# Patient Record
Sex: Male | Born: 1950 | Race: White | Hispanic: No | Smoking: Former smoker
Health system: Southern US, Community
[De-identification: ages and names within clinical notes are randomized; demographics above are authoritative.]

## PROBLEM LIST (undated history)

## (undated) DIAGNOSIS — I452 Bifascicular block: Secondary | ICD-10-CM

## (undated) DIAGNOSIS — G959 Disease of spinal cord, unspecified: Secondary | ICD-10-CM

## (undated) DIAGNOSIS — E291 Testicular hypofunction: Secondary | ICD-10-CM

## (undated) DIAGNOSIS — M1990 Unspecified osteoarthritis, unspecified site: Secondary | ICD-10-CM

## (undated) DIAGNOSIS — N882 Stricture and stenosis of cervix uteri: Secondary | ICD-10-CM

## (undated) DIAGNOSIS — M5412 Radiculopathy, cervical region: Secondary | ICD-10-CM

## (undated) DIAGNOSIS — I10 Essential (primary) hypertension: Secondary | ICD-10-CM

## (undated) DIAGNOSIS — E785 Hyperlipidemia, unspecified: Secondary | ICD-10-CM

## (undated) DIAGNOSIS — I4510 Unspecified right bundle-branch block: Secondary | ICD-10-CM

## (undated) DIAGNOSIS — I2510 Atherosclerotic heart disease of native coronary artery without angina pectoris: Secondary | ICD-10-CM

## (undated) DIAGNOSIS — N529 Male erectile dysfunction, unspecified: Secondary | ICD-10-CM

## (undated) HISTORY — PX: JOINT REPLACEMENT: SHX530

## (undated) HISTORY — PX: CORONARY ANGIOPLASTY: SHX604

## (undated) HISTORY — PX: COLONOSCOPY: SHX174

---

## 2013-03-08 HISTORY — PX: CARDIAC CATHETERIZATION: SHX172

## 2014-02-05 HISTORY — PX: TOTAL HIP ARTHROPLASTY: SHX124

## 2015-02-05 DIAGNOSIS — I2510 Atherosclerotic heart disease of native coronary artery without angina pectoris: Secondary | ICD-10-CM | POA: Insufficient documentation

## 2015-02-05 DIAGNOSIS — I10 Essential (primary) hypertension: Secondary | ICD-10-CM | POA: Insufficient documentation

## 2015-02-05 DIAGNOSIS — Z8639 Personal history of other endocrine, nutritional and metabolic disease: Secondary | ICD-10-CM | POA: Insufficient documentation

## 2015-02-05 DIAGNOSIS — E782 Mixed hyperlipidemia: Secondary | ICD-10-CM | POA: Insufficient documentation

## 2015-02-05 DIAGNOSIS — N529 Male erectile dysfunction, unspecified: Secondary | ICD-10-CM | POA: Insufficient documentation

## 2018-09-03 DIAGNOSIS — M1711 Unilateral primary osteoarthritis, right knee: Secondary | ICD-10-CM | POA: Insufficient documentation

## 2019-06-21 ENCOUNTER — Other Ambulatory Visit: Payer: Self-pay | Admitting: Podiatry

## 2019-06-26 NOTE — Anesthesia Preprocedure Evaluation (Addendum)
Anesthesia Evaluation  Patient identified by MRN, date of birth, ID band Patient awake    Reviewed: Allergy & Precautions, NPO status , Patient's Chart, lab work & pertinent test results, reviewed documented beta blocker date and time   History of Anesthesia Complications Negative for: history of anesthetic complications  Airway Mallampati: II  TM Distance: >3 FB Neck ROM: Full    Dental   Pulmonary Current Smoker and Patient abstained from smoking.,    breath sounds clear to auscultation       Cardiovascular hypertension, (-) angina(-) DOE  Rhythm:Regular Rate:Normal   HLD   Neuro/Psych    GI/Hepatic neg GERD  ,  Endo/Other    Renal/GU      Musculoskeletal  (+) Arthritis ,   Abdominal (+) + obese (BMI 36),   Peds  Hematology   Anesthesia Other Findings   Reproductive/Obstetrics                             Anesthesia Physical Anesthesia Plan  ASA: II  Anesthesia Plan: General   Post-op Pain Management:    Induction: Intravenous  PONV Risk Score and Plan: 1 and Propofol infusion, TIVA and Treatment may vary due to age or medical condition  Airway Management Planned: Natural Airway and Nasal Cannula  Additional Equipment:   Intra-op Plan:   Post-operative Plan:   Informed Consent: I have reviewed the patients History and Physical, chart, labs and discussed the procedure including the risks, benefits and alternatives for the proposed anesthesia with the patient or authorized representative who has indicated his/her understanding and acceptance.       Plan Discussed with: CRNA and Anesthesiologist  Anesthesia Plan Comments:        Anesthesia Quick Evaluation

## 2019-06-27 ENCOUNTER — Other Ambulatory Visit: Payer: Self-pay

## 2019-06-27 ENCOUNTER — Encounter: Payer: Self-pay | Admitting: Podiatry

## 2019-07-02 ENCOUNTER — Other Ambulatory Visit: Payer: Self-pay

## 2019-07-02 ENCOUNTER — Other Ambulatory Visit
Admission: RE | Admit: 2019-07-02 | Discharge: 2019-07-02 | Disposition: A | Discharge status: Home / Self Care | Payer: Medicare Other | Source: Ambulatory Visit | Attending: Podiatry | Admitting: Podiatry

## 2019-07-02 DIAGNOSIS — Z01812 Encounter for preprocedural laboratory examination: Secondary | ICD-10-CM | POA: Diagnosis present

## 2019-07-02 DIAGNOSIS — Z20822 Contact with and (suspected) exposure to covid-19: Secondary | ICD-10-CM | POA: Diagnosis not present

## 2019-07-02 LAB — SARS CORONAVIRUS 2 (TAT 6-24 HRS): SARS Coronavirus 2: NEGATIVE

## 2019-07-02 NOTE — Discharge Instructions (Signed)
[facility]  POST OPERATIVE INSTRUCTIONS FOR DR. TROXLER, DR. FOWLER, AND DR. BAK[facility] PODIATRY DEPART[facility]   1. Take your medication as prescribed.  Pain medication should be taken only as needed.  2. Keep the dressing clean, dry and intact.  3. Keep your foot elevated above the heart level for the first 48 hours.  4. Walking to the bathroom and brief periods of walking are acceptable, unless we have instructed you to be non-weight bearing.  5. Always wear your post-op shoe when walking.  Always use your crutches if you are to be non-weight bearing.  6. Do not take a shower. Baths are permissible as long as the foot is kept out of the water.   7. Every hour you are awake:  - Bend your knee 15 times. - Flex foot 15 times - Massage calf 15 times  8. Call [facility] ([phone]) if any of the following problems occur: - You develop a temperature or fever. - The bandage becomes saturated with blood. - Medication does not stop your pain. - Injury of the foot occurs. - Any symptoms of infection including redness, odor, or red streaks running from wound.   General Anesthesia, Adult, Care After This sheet gives you information about how to care for yourself after your procedure. Your health care provider may also give you more specific instructions. If you have problems or questions, contact your health care provider. What can I expect after the procedure? After the procedure, the following side effects are common:  Pain or discomfort at the IV site.  Nausea.  Vomiting.  Sore throat.  Trouble concentrating.  Feeling cold or chills.  Weak or tired.  Sleepiness and fatigue.  Soreness and body aches. These side effects can affect parts of the body that were not involved in surgery. Follow these instructions at home:  For at least 24 hours after the procedure:  Have a responsible adult stay with you. It is  important to have someone help care for you until you are awake and alert.  Rest as needed.  Do not: ? Participate in activities in which you could fall or become injured. ? Drive. ? Use heavy machinery. ? Drink alcohol. ? Take sleeping pills or medicines that cause drowsiness. ? Make important decisions or sign legal documents. ? Take care of children on your own. Eating and drinking  Follow any instructions from your health care provider about eating or drinking restrictions.  When you feel hungry, start by eating small amounts of foods that are soft and easy to digest (bland), such as toast. Gradually return to your regular diet.  Drink enough fluid to keep your urine pale yellow.  If you vomit, rehydrate by drinking water, juice, or clear broth. General instructions  If you have sleep apnea, surgery and certain medicines can increase your risk for breathing problems. Follow instructions from your health care provider about wearing your sleep device: ? Anytime you are sleeping, including during daytime naps. ? While taking prescription pain medicines, sleeping medicines, or medicines that make you drowsy.  Return to your normal activities as told by your health care provider. Ask your health care provider what activities are safe for you.  Take over-the-counter and prescription medicines only as told by your health care provider.  If you smoke, do not smoke without supervision.  Keep all follow-up visits as told by your health care provider. This is important. Contact a health care provider if:    You have nausea or vomiting that does not get better with medicine.  You cannot eat or drink without vomiting.  You have pain that does not get better with medicine.  You are unable to pass urine.  You develop a skin rash.  You have a fever.  You have redness around your IV site that gets worse. Get help right away if:  You have difficulty breathing.  You have chest  pain.  You have blood in your urine or stool, or you vomit blood. Summary  After the procedure, it is common to have a sore throat or nausea. It is also common to feel tired.  Have a responsible adult stay with you for the first 24 hours after general anesthesia. It is important to have someone help care for you until you are awake and alert.  When you feel hungry, start by eating small amounts of foods that are soft and easy to digest (bland), such as toast. Gradually return to your regular diet.  Drink enough fluid to keep your urine pale yellow.  Return to your normal activities as told by your health care provider. Ask your health care provider what activities are safe for you. This information is not intended to replace advice given to you by your health care provider. Make sure you discuss any questions you have with your health care provider. Document Revised: 02/25/2017 Document Reviewed: 10/08/2016 Elsevier Patient Education  2020 [employer] Inc[employer]  

## 2019-07-04 ENCOUNTER — Encounter: Payer: Self-pay | Admitting: Podiatry

## 2019-07-04 ENCOUNTER — Ambulatory Visit: Payer: Medicare Other | Admitting: Anesthesiology

## 2019-07-04 ENCOUNTER — Other Ambulatory Visit: Payer: Self-pay

## 2019-07-04 ENCOUNTER — Ambulatory Visit
Admission: RE | Admit: 2019-07-04 | Discharge: 2019-07-04 | Disposition: A | Discharge status: Home / Self Care | Payer: Medicare Other | Attending: Podiatry | Admitting: Podiatry

## 2019-07-04 ENCOUNTER — Encounter
Admission: RE | Disposition: A | Discharge status: Home / Self Care | Payer: Self-pay | Source: Home / Self Care | Attending: Podiatry

## 2019-07-04 DIAGNOSIS — G5762 Lesion of plantar nerve, left lower limb: Secondary | ICD-10-CM | POA: Diagnosis present

## 2019-07-04 DIAGNOSIS — M170 Bilateral primary osteoarthritis of knee: Secondary | ICD-10-CM | POA: Insufficient documentation

## 2019-07-04 DIAGNOSIS — I2510 Atherosclerotic heart disease of native coronary artery without angina pectoris: Secondary | ICD-10-CM | POA: Diagnosis not present

## 2019-07-04 DIAGNOSIS — Z79899 Other long term (current) drug therapy: Secondary | ICD-10-CM | POA: Diagnosis not present

## 2019-07-04 DIAGNOSIS — F17210 Nicotine dependence, cigarettes, uncomplicated: Secondary | ICD-10-CM | POA: Insufficient documentation

## 2019-07-04 DIAGNOSIS — E782 Mixed hyperlipidemia: Secondary | ICD-10-CM | POA: Diagnosis not present

## 2019-07-04 DIAGNOSIS — I10 Essential (primary) hypertension: Secondary | ICD-10-CM | POA: Diagnosis not present

## 2019-07-04 HISTORY — DX: Essential (primary) hypertension: I10

## 2019-07-04 HISTORY — DX: Unspecified osteoarthritis, unspecified site: M19.90

## 2019-07-04 HISTORY — DX: Hyperlipidemia, unspecified: E78.5

## 2019-07-04 HISTORY — PX: EXCISION MORTON'S NEUROMA: SHX5013

## 2019-07-04 SURGERY — EXCISION, MORTON'S NEUROMA
Anesthesia: General | Site: Toe | Laterality: Left

## 2019-07-04 MED ORDER — FENTANYL CITRATE (PF) 100 MCG/2ML IJ SOLN
INTRAMUSCULAR | Status: DC | PRN
  Administered 2019-07-04: 100 ug via INTRAVENOUS

## 2019-07-04 MED ORDER — PROMETHAZINE HCL 25 MG/ML IJ SOLN: 6.2500 mg | INTRAMUSCULAR | Status: DC | PRN

## 2019-07-04 MED ORDER — POVIDONE-IODINE 7.5 % EX SOLN: Freq: Once | CUTANEOUS | Status: AC

## 2019-07-04 MED ORDER — CEFAZOLIN SODIUM-DEXTROSE 2-4 GM/100ML-% IV SOLN
2.0000 g | INTRAVENOUS | Status: AC
  Administered 2019-07-04: 2 g via INTRAVENOUS

## 2019-07-04 MED ORDER — MIDAZOLAM HCL 2 MG/2ML IJ SOLN
INTRAMUSCULAR | Status: DC | PRN
  Administered 2019-07-04: 2 mg via INTRAVENOUS

## 2019-07-04 MED ORDER — OXYCODONE HCL 5 MG PO TABS: 5.0000 mg | ORAL_TABLET | Freq: Once | ORAL | Status: DC | PRN

## 2019-07-04 MED ORDER — LACTATED RINGERS IV SOLN
100.0000 mL/h | INTRAVENOUS | Status: DC
  Administered 2019-07-04: 100 mL/h via INTRAVENOUS

## 2019-07-04 MED ORDER — HYDROMORPHONE HCL 1 MG/ML IJ SOLN: 0.2500 mg | INTRAMUSCULAR | Status: DC | PRN

## 2019-07-04 MED ORDER — LIDOCAINE-EPINEPHRINE 1 %-1:100000 IJ SOLN
INTRAMUSCULAR | Status: DC | PRN
  Administered 2019-07-04: 3.5 mL

## 2019-07-04 MED ORDER — OXYCODONE HCL 5 MG/5ML PO SOLN: 5.0000 mg | Freq: Once | ORAL | Status: DC | PRN

## 2019-07-04 MED ORDER — LIDOCAINE HCL (CARDIAC) PF 100 MG/5ML IV SOSY
PREFILLED_SYRINGE | INTRAVENOUS | Status: DC | PRN
  Administered 2019-07-04: 40 mg via INTRAVENOUS

## 2019-07-04 MED ORDER — ONDANSETRON HCL 4 MG PO TABS: 4.0000 mg | ORAL_TABLET | Freq: Four times a day (QID) | ORAL | Status: DC | PRN

## 2019-07-04 MED ORDER — HYDROCODONE-ACETAMINOPHEN 5-325 MG PO TABS: 1.0000 | ORAL_TABLET | Freq: Four times a day (QID) | ORAL | 0 refills | Status: DC | PRN

## 2019-07-04 MED ORDER — HEMOSTATIC AGENTS (NO CHARGE) OPTIME
TOPICAL | Status: DC | PRN
Start: 2019-07-04 — End: 2019-07-04
  Administered 2019-07-04: 1 via TOPICAL

## 2019-07-04 MED ORDER — BUPIVACAINE HCL (PF) 0.25 % IJ SOLN
INTRAMUSCULAR | Status: DC | PRN
  Administered 2019-07-04: 13.5 mL

## 2019-07-04 MED ORDER — PROPOFOL 500 MG/50ML IV EMUL
INTRAVENOUS | Status: DC | PRN
  Administered 2019-07-04: 75 ug/kg/min via INTRAVENOUS

## 2019-07-04 MED ORDER — ONDANSETRON HCL 4 MG/2ML IJ SOLN: 4.0000 mg | Freq: Four times a day (QID) | INTRAMUSCULAR | Status: DC | PRN

## 2019-07-04 SURGICAL SUPPLY — 32 items
BANDAGE ELASTIC 4 VELCRO NS (GAUZE/BANDAGES/DRESSINGS) ×2 IMPLANT
BNDG COHESIVE 4X5 TAN STRL (GAUZE/BANDAGES/DRESSINGS) ×2 IMPLANT
BNDG ESMARK 4X12 TAN STRL LF (GAUZE/BANDAGES/DRESSINGS) ×2 IMPLANT
BNDG GAUZE 4.5X4.1 6PLY STRL (MISCELLANEOUS) ×2 IMPLANT
BNDG STRETCH 4X75 STRL LF (GAUZE/BANDAGES/DRESSINGS) ×2 IMPLANT
CANISTER SUCT 1200ML W/VALVE (MISCELLANEOUS) ×2 IMPLANT
COVER LIGHT HANDLE UNIVERSAL (MISCELLANEOUS) ×2 IMPLANT
DURAPREP 26ML APPLICATOR (WOUND CARE) ×2 IMPLANT
ELECT REM PT RETURN 9FT ADLT (ELECTROSURGICAL) ×2
ELECTRODE REM PT RTRN 9FT ADLT (ELECTROSURGICAL) ×1 IMPLANT
GAUZE SPONGE 4X4 12PLY STRL (GAUZE/BANDAGES/DRESSINGS) ×2 IMPLANT
GAUZE XEROFORM 1X8 LF (GAUZE/BANDAGES/DRESSINGS) ×2 IMPLANT
GLOVE BIO SURGEON STRL SZ7.5 (GLOVE) ×2 IMPLANT
GLOVE INDICATOR 8.0 STRL GRN (GLOVE) ×2 IMPLANT
GOWN STRL REUS W/ TWL LRG LVL3 (GOWN DISPOSABLE) ×2 IMPLANT
GOWN STRL REUS W/TWL LRG LVL3 (GOWN DISPOSABLE) ×2
HEMOSTAT SURGICEL 2X3 (HEMOSTASIS) ×2 IMPLANT
NEEDLE HYPO 25GX1X1/2 BEV (NEEDLE) ×4 IMPLANT
NS IRRIG 500ML POUR BTL (IV SOLUTION) ×2 IMPLANT
PACK EXTREMITY ARMC (MISCELLANEOUS) ×2 IMPLANT
PENCIL SMOKE EVACUATOR (MISCELLANEOUS) ×2 IMPLANT
STOCKINETTE IMPERVIOUS LG (DRAPES) ×2 IMPLANT
STRAP BODY AND KNEE 60X3 (MISCELLANEOUS) ×2 IMPLANT
STRIP CLOSURE SKIN 1/4X4 (GAUZE/BANDAGES/DRESSINGS) IMPLANT
SUT ETHILON 4 0 PS 2 18 (SUTURE) ×2 IMPLANT
SUT MNCRL+ 5-0 UNDYED PC-3 (SUTURE) ×1 IMPLANT
SUT MONOCRYL 5-0 (SUTURE) ×1
SUT VIC AB 3-0 SH 27 (SUTURE) ×1
SUT VIC AB 3-0 SH 27X BRD (SUTURE) ×1 IMPLANT
SUT VIC AB 4-0 FS2 27 (SUTURE) ×2 IMPLANT
SWABSTK COMLB BENZOIN TINCTURE (MISCELLANEOUS) IMPLANT
SYR 10ML LL (SYRINGE) ×2 IMPLANT

## 2019-07-04 NOTE — Anesthesia Procedure Notes (Signed)
Performed by: Xitlally Mooneyham, CRNA Pre-anesthesia Checklist: Patient identified, Emergency Drugs available, Suction available, Timeout performed and Patient being monitored Patient Re-evaluated:Patient Re-evaluated prior to induction Oxygen Delivery Method: Simple face mask Placement Confirmation: positive ETCO2       

## 2019-07-04 NOTE — H&P (Signed)
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  The various methods of treatment have been discussed with the patient.  No guarantees were given.  After consideration of risks, benefits and other options for treatment, the patient has consented to surgery.  I have reviewed the patients' chart and labs.     Wilson history and physical examination was performed in my office.  The patient was reexamined.  There have been no changes to this history and physical examination.  Mario Wilson

## 2019-07-04 NOTE — Transfer of Care (Signed)
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

## 2019-07-04 NOTE — Anesthesia Postprocedure Evaluation (Signed)
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

## 2019-07-04 NOTE — Op Note (Signed)
Operative note   Surgeon:Nykayla Marcelli Armed forces logistics/support/administrative officer: None    Preop diagnosis: Neuroma left second webspace    Postop diagnosis: Same    Procedure: Excision neuroma left second webspace    EBL: Minimal    Anesthesia:local and IV sedation.  Local consisted of 5 cc of 1% lidocaine with epinephrine and 15 cc of 0.25% bupivacaine    Hemostasis: Epinephrine infiltrated along the incision site    Specimen: Neuroma for pathology    Complications: None    Operative indications:Mario Wilson is an 69 y.o. that presents today for surgical intervention.  The risks/benefits/alternatives/complications have been discussed and consent has been given.    Procedure:  Patient was brought into the OR and placed on the operating table in thesupine position. After anesthesia was obtained theleft lower extremity was prepped and draped in usual sterile fashion.  Attention was directed to the left second webspace where a longitudinal incision was performed from the space along the intermetatarsal region.  Sharp and blunt dissection carried down to the DTI L and this was transected.  At this time compression was applied to the plantar aspect of the foot and a large bulbous soft tissue mass was noted with extension to the digits and proximal.  This was then transected out and removed from the surgical field in toto.  All bleeders were Bovie cauterized as needed.  The wound was flushed with copious amounts of irrigation.  Layered closure was performed with a 3-0 Vicryl for the deeper layer and a 3-0 nylon for skin.  A bulky sterile dressing was then applied.    Patient tolerated the procedure and anesthesia well.  Was transported from the OR to the PACU with all vital signs stable and vascular status intact. To be discharged per routine protocol.  Will follow up in approximately 1 week in the outpatient clinic.

## 2019-07-05 ENCOUNTER — Encounter: Payer: Self-pay | Admitting: *Deleted

## 2019-07-10 LAB — SURGICAL PATHOLOGY

## 2019-07-29 NOTE — Discharge Instructions (Signed)
Instructions after Total Knee Replacement   Kerron Sedano P. Dawon Troop, Jr., M.D.     Dept. of Orthopaedics & Sports Medicine  [facility]  [address]  [place], [place]  [address]  Phone: [phone]   Fax: [phone]    DIET: Drink plenty of non-alcoholic fluids. Resume your normal diet. Include foods high in fiber.  ACTIVITY:  You may use crutches or a walker with weight-bearing as tolerated, unless instructed otherwise. You may be weaned off of the walker or crutches by your Physical Therapist.  Do NOT place pillows under the knee. Anything placed under the knee could limit your ability to straighten the knee.   Continue doing gentle exercises. Exercising will reduce the pain and swelling, increase motion, and prevent muscle weakness.   Please continue to use the TED compression stockings for 6 weeks. You may remove the stockings at night, but should reapply them in the morning. Do not drive or operate any equipment until instructed.  WOUND CARE:  Continue to use the PolarCare or ice packs periodically to reduce pain and swelling. You may bathe or shower after the staples are removed at the first office visit following surgery.  MEDICATIONS: You may resume your regular medications. Please take the pain medication as prescribed on the medication. Do not take pain medication on an empty stomach. You have been given a prescription for a blood thinner (Lovenox or Coumadin). Please take the medication as instructed. (NOTE: After completing a 2 week course of Lovenox, take one Enteric-coated aspirin once a day. This along with elevation will help reduce the possibility of phlebitis in your operated leg.) Do not drive or drink alcoholic beverages when taking pain medications.  CALL THE OFFICE FOR: Temperature above 101 degrees Excessive bleeding or drainage on the dressing. Excessive swelling, coldness, or paleness of the toes. Persistent nausea and vomiting.  FOLLOW-UP:  You  should have an appointment to return to the office in 10-14 days after surgery. Arrangements have been made for continuation of Physical Therapy (either home therapy or outpatient therapy).   [facility] Department Directory         [email or web address]       [email or web address]          Cardiology  Appointments: [place] - [phone] [place] - [phone]  Endocrinology  Appointments: [place] - [phone] [place] - [phone]  Gastroenterology  Appointments: [place] - [phone] [place] - [phone]        General Surgery   Appointments: [place] - [phone]  Internal Medicine/Family Medicine  Appointments: [place] - [phone] [place] - [phone] [place] - [phone]  Metabolic and Weigh Loss Surgery  Appointments: [place] - [phone]        Neurology  Appointments: [place] - [phone] [place] - [phone]  Neurosurgery  Appointments: [place] - [phone]  Obstetrics & Gynecology  Appointments: [place] - [phone] [place] - [phone]        Pediatrics  Appointments: [place] - [phone] [place] - [phone]  Physiatry  Appointments: [place] -[phone]  Physical Therapy  Appointments: [place] - [phone] [place] - [phone]        Podiatry  Appointments: [place] - [phone] [place] - [phone]  Pulmonology  Appointments: [place] - [phone]  Rheumatology  Appointments: [place] - [phone]        [place] Location: [facility]  [address] [place]  [address]  [place] Location: [facility] [address] [place], [place]  [address]  [place] Location: [facility] [address] [place], [place]  [address]    

## 2019-08-02 ENCOUNTER — Encounter
Admission: RE | Admit: 2019-08-02 | Discharge: 2019-08-02 | Disposition: A | Discharge status: Home / Self Care | Payer: Medicare Other | Source: Ambulatory Visit | Attending: Orthopedic Surgery | Admitting: Orthopedic Surgery

## 2019-08-02 ENCOUNTER — Other Ambulatory Visit: Payer: Self-pay

## 2019-08-02 NOTE — Patient Instructions (Addendum)
COVID TESTING Date: Thursday, June 10 Testing site:  [facility] [address] Thru Hours:  5:78 am - 1:00 pm Once you are tested, you are asked to stay quarantined (avoiding public places) until after your surgery.   Your procedure is scheduled on: Monday, June 14 Report to Day Surgery on the 2nd floor of the [facility] To find out your arrival time, please call [phone] between 1PM - 3PM on: Friday, June 11  REMEMBER: Instructions that are not followed completely may result in serious medical risk, up to and including death; or upon the discretion of your surgeon and anesthesiologist your surgery may need to be rescheduled.  Do not eat food after midnight the night before surgery.  No gum chewing, lozengers or hard candies.  You may however, drink CLEAR liquids up to 2 hours before you are scheduled to arrive for your surgery. Do not drink anything within 2 hours of your scheduled arrival time.  Clear liquids include: - water  - apple juice without pulp - gatorade (not RED) - black coffee or tea (Do NOT add milk or creamers to the coffee or tea) Do NOT drink anything that is not on this list.  ENSURE PRE-SURGERY CARBOHYDRATE DRINK:  Complete drinking 2 hours prior to scheduled arrival time.  TAKE THESE MEDICATIONS THE MORNING OF SURGERY WITH A SIP OF WATER:  1.  Amlodipine  1 week prior to surgery: Stop Anti-inflammatories (NSAIDS) such as Advil, Aleve, Ibuprofen, Motrin, Naproxen, Naprosyn and Aspirin based products such as Excedrin, Goodys Powder, BC Powder. (May take Tylenol or Acetaminophen if needed.)  Stop ANY OVER THE COUNTER supplements until after surgery.  No Alcohol for 24 hours before or after surgery.  No Smoking including e-cigarettes for 24 hours prior to surgery.  No chewable tobacco products for at least 6 hours prior to surgery.  No nicotine patches on the day of surgery.  On the morning of surgery brush your teeth  with toothpaste and water, you may rinse your mouth with mouthwash if you wish. Do not swallow any toothpaste or mouthwash.  Do not wear jewelry, make-up, hairpins, clips or nail polish.  Do not wear lotions, powders, or perfumes.   Do not shave 48 hours prior to surgery.   Do not bring valuables to the hospital. [facility] is not responsible for any missing/lost belongings or valuables.   Use CHG Soap as directed on instruction sheet.  Notify your doctor if there is any change in your medical condition (cold, fever, infection).  Wear comfortable clothing (specific to your surgery type) to the hospital.  Plan for stool softeners for home use; pain medications have a tendency to cause constipation. You can also help prevent constipation by eating foods high in fiber such as fruits and vegetables and drinking plenty of fluids as your diet allows.  After surgery, you can help prevent lung complications by doing breathing exercises.  Take deep breaths and cough every 1-2 hours. Your doctor may order a device called an Incentive Spirometer to help you take deep breaths.  If you are being admitted to the hospital overnight, leave your suitcase in the car. After surgery it may be brought to your room.  Please call the Pre-admissions Testing Dept. at [phone] if you have any questions about these instructions.  Visitation Policy:  Patients undergoing a surgery or procedure may have one family member or support person with them as long as that person is not COVID-19 positive  or experiencing its symptoms.  That person may remain in the waiting area during the procedure.  Inpatient Visitation Update:  Two designated support people may visit a patient during visiting hours 7 am to 8 pm. It must be the same two designated people for the duration of the patient stay. The visitors may come and go during the day, and there is no switching out to have different visitors. A mask must be worn at  all times, including in the patient room.

## 2019-08-08 ENCOUNTER — Other Ambulatory Visit: Payer: Self-pay

## 2019-08-08 ENCOUNTER — Encounter
Admission: RE | Admit: 2019-08-08 | Discharge: 2019-08-08 | Disposition: A | Discharge status: Home / Self Care | Payer: Medicare Other | Source: Ambulatory Visit | Attending: Orthopedic Surgery | Admitting: Orthopedic Surgery

## 2019-08-08 DIAGNOSIS — Z01818 Encounter for other preprocedural examination: Secondary | ICD-10-CM | POA: Insufficient documentation

## 2019-08-08 LAB — TYPE AND SCREEN
ABO/RH(D): B POS
Antibody Screen: NEGATIVE

## 2019-08-08 LAB — COMPREHENSIVE METABOLIC PANEL
ALT: 31 U/L (ref 0–44)
AST: 25 U/L (ref 15–41)
Albumin: 4.4 g/dL (ref 3.5–5.0)
Alkaline Phosphatase: 63 U/L (ref 38–126)
Anion gap: 11 (ref 5–15)
BUN: 24 mg/dL — ABNORMAL HIGH (ref 8–23)
CO2: 25 mmol/L (ref 22–32)
Calcium: 8.9 mg/dL (ref 8.9–10.3)
Chloride: 101 mmol/L (ref 98–111)
Creatinine, Ser: 1.18 mg/dL (ref 0.61–1.24)
GFR calc Af Amer: >60 mL/min (ref >60)
GFR calc non Af Amer: >60 mL/min (ref >60)
Glucose, Bld: 109 mg/dL — ABNORMAL HIGH (ref 70–99)
Potassium: 3.5 mmol/L (ref 3.5–5.1)
Sodium: 137 mmol/L (ref 135–145)
Total Bilirubin: 1.1 mg/dL (ref 0.3–1.2)
Total Protein: 7.1 g/dL (ref 6.5–8.1)

## 2019-08-08 LAB — URINALYSIS, ROUTINE W REFLEX MICROSCOPIC
Bilirubin Urine: NEGATIVE
Glucose, UA: NEGATIVE mg/dL
Hgb urine dipstick: NEGATIVE
Ketones, ur: NEGATIVE mg/dL
Leukocytes,Ua: NEGATIVE
Nitrite: NEGATIVE
Protein, ur: NEGATIVE mg/dL
Specific Gravity, Urine: 1.02 (ref 1.005–1.030)
pH: 5 (ref 5.0–8.0)

## 2019-08-08 LAB — SURGICAL PCR SCREEN
MRSA, PCR: NEGATIVE
Staphylococcus aureus: POSITIVE — AB

## 2019-08-08 LAB — CBC
HCT: 39.9 % (ref 39.0–52.0)
Hemoglobin: 14.6 g/dL (ref 13.0–17.0)
MCH: 32.2 pg (ref 26.0–34.0)
MCHC: 36.6 g/dL — ABNORMAL HIGH (ref 30.0–36.0)
MCV: 88.1 fL (ref 80.0–100.0)
Platelets: 149 10*3/uL — ABNORMAL LOW (ref 150–400)
RBC: 4.53 MIL/uL (ref 4.22–5.81)
RDW: 13.1 % (ref 11.5–15.5)
WBC: 5.5 10*3/uL (ref 4.0–10.5)
nRBC: 0 % (ref 0.0–0.2)

## 2019-08-08 LAB — SEDIMENTATION RATE: Sed Rate: 27 mm/hr — ABNORMAL HIGH (ref 0–20)

## 2019-08-08 LAB — PROTIME-INR
INR: 1 (ref 0.8–1.2)
Prothrombin Time: 12.7 seconds (ref 11.4–15.2)

## 2019-08-08 LAB — C-REACTIVE PROTEIN: CRP: 0.5 mg/dL (ref <1.0)

## 2019-08-08 LAB — APTT: aPTT: 34 seconds (ref 24–36)

## 2019-08-08 NOTE — Pre-Procedure Instructions (Signed)
Pre-Admit Testing Provider Notification Note  Provider Notified: [place]  Notification Mode: Fax  Reason: Abnormal lab result.  Response: Fax confirmation received.   Additional Information: Placed on chart. Noted on Pre-Admit Worksheet.  Signed: Layn Kye, RN  

## 2019-08-08 NOTE — Pre-Procedure Instructions (Signed)
Pre-Admit Testing Provider Notification Note  Provider Notified: Dr. Ernest Pine  Notification Mode: Fax  Reason: Request for clearance.  Response: Fax confirmation received.  Additional Information: Called and notified Consuella Lose that clearance request was on the way.  Signed: Alvester Morin, RN

## 2019-08-09 LAB — URINE CULTURE
Culture: NO GROWTH
Special Requests: NORMAL

## 2019-08-16 ENCOUNTER — Other Ambulatory Visit
Admission: RE | Admit: 2019-08-16 | Discharge: 2019-08-16 | Disposition: A | Discharge status: Home / Self Care | Payer: Medicare Other | Source: Ambulatory Visit | Attending: Orthopedic Surgery | Admitting: Orthopedic Surgery

## 2019-08-16 ENCOUNTER — Other Ambulatory Visit: Payer: Self-pay

## 2019-08-16 DIAGNOSIS — Z20822 Contact with and (suspected) exposure to covid-19: Secondary | ICD-10-CM | POA: Diagnosis not present

## 2019-08-16 DIAGNOSIS — Z01812 Encounter for preprocedural laboratory examination: Secondary | ICD-10-CM | POA: Insufficient documentation

## 2019-08-16 DIAGNOSIS — Z955 Presence of coronary angioplasty implant and graft: Secondary | ICD-10-CM | POA: Insufficient documentation

## 2019-08-17 LAB — SARS CORONAVIRUS 2 (TAT 6-24 HRS): SARS Coronavirus 2: NEGATIVE

## 2019-08-19 ENCOUNTER — Encounter: Payer: Self-pay | Admitting: Orthopedic Surgery

## 2019-08-19 NOTE — H&P (Signed)
ORTHOPAEDIC HISTORY & PHYSICAL  Progress Notes Michelene Gardener, PA - 08/10/2019 10:00 AM EDT [facility] Chief Complaint:   Chief Complaint  Patient presents with  . Knee Pain  H & P LEFT KNEE   History of Present Illness:   Mario Wilson is a 69 y.o. male that presents to clinic today for his preoperative history and evaluation. The patient is scheduled to undergo a left total knee arthroplasty on 08/20/19 by Dr. Ernest Pine. He reports a long history of left knee pain. The pain is located primarily along the medial aspect of the left knee. He describes his pain as aggravated by any weightbearing. He reports associated swelling with some giving way of the knees. He denies associated numbness or tingling, denies locking.   The patient's symptoms have progressed to the point that they decrease his quality of life. The patient has previously undergone conservative treatment including NSAIDS and injections to the knee without adequate control of his symptoms.  Denies history of blood clots, lumbar surgery, or significant cardiac history.   Past Medical, Surgical, Family, Social History, Allergies, Medications:   Past Medical History:  Past Medical History:  Diagnosis Date  . Hyperlipidemia  . Hypertension   Past Surgical History:  Past Surgical History:  Procedure Laterality Date  . ARTHROPLASTY HIP TOTAL Left 02/2014  . coronary stent 2015   Current Medications:  Current Outpatient Medications  Medication Sig Dispense Refill  . aspirin 81 MG EC tablet Take 81 mg by mouth once daily  . amLODIPine (NORVASC) 10 MG tablet Take 1 tablet (10 mg total) by mouth once daily 90 tablet 3  . atorvastatin (LIPITOR) 40 MG tablet Take 1 tablet (40 mg total) by mouth once daily 90 tablet 3  . celecoxib (CELEBREX) 200 MG capsule Take 1 capsule (200 mg total) by mouth 2 (two) times daily 60 capsule 3  . lisinopriL-hydrochlorothiazide (ZESTORETIC) 20-25  mg tablet Take 1 tablet by mouth 2 (two) times daily 180 tablet 3   No current facility-administered medications for this visit.   Allergies: No Known Allergies  Social History:  Social History   Socioeconomic History  . Marital status: Divorced  Spouse name: Not on file  . Number of children: 1  . Years of education: 48  . Highest education level: Bachelor's degree (e.g., BA, AB, BS)  Occupational History  . Occupation: Retired  Tobacco Use  . Smoking status: Current Every Day Smoker  Packs/day: 1.00  Years: 10.00  Pack years: 10.00  Types: Cigars, Cigarettes  Last attempt to quit: 1990  Years since quitting: 31.4  . Smokeless tobacco: Never Used  . Tobacco comment: Smokes 3 cigars a day now  Vaping Use  . Vaping Use: Never used  Substance and Sexual Activity  . Alcohol use: Yes  Alcohol/week: 6.0 standard drinks  Types: 6 Cans of beer per week  Comment: under 6 per week  . Drug use: No  . Sexual activity: Defer  Partners: Female  Other Topics Concern  . Not on file  Social History Narrative  Married, 1 son  Retired from Architect  1 cigar per day x6 months  No hx of smoking otherwise  4 drinks per week  Caffeine: 1 per day  Exercise: workouts 5x/week   Social Determinants of Health   Financial Resource Strain:  . Difficulty of Paying Living Expenses:  Food Insecurity:  . Worried About Programme researcher, broadcasting/film/video in the Last Year:  .  Ran Out of Food in the Last Year:  Transportation Needs:  . Film/video editor (Medical):  Marland Kitchen Lack of Transportation (Non-Medical):  Physical Activity:  . Days of Exercise per Week:  . Minutes of Exercise per Session:  Stress:  . Feeling of Stress :  Social Connections:  . Frequency of Communication with Friends and Family:  . Frequency of Social Gatherings with Friends and Family:  . Attends Religious Services:  . Active Member of Clubs or Organizations:  . Attends Archivist Meetings:  Marland Kitchen Marital  Status:   Family History:  Family History  Problem Relation Age of Onset  . No Known Problems Mother  . No Known Problems Father   Review of Systems:   A 10+ ROS was performed, reviewed, and the pertinent orthopaedic findings are documented in the HPI.   Physical Examination:   BP 130/80  Ht 182.9 cm (6')  Wt (!) 119 kg (262 lb 6.4 oz)  BMI 35.59 kg/m   Patient is a well-developed, well-nourished male in no acute distress. Patient has normal mood and affect. Patient is alert and oriented to person, place, and time.   HEENT: Atraumatic, normocephalic. Pupils equal and reactive to light. Extraocular motion intact. Noninjected sclera.  Cardiovascular: Regular rate and rhythm, with no murmurs, rubs, or gallops. Distal pulses palpable.  Respiratory: Lungs clear to auscultation bilaterally.   126, full   Sensation intact over the saphenous, lateral sural cutaneous, superficial fibular, and deep fibular nerve distributions.  Tests Performed/Reviewed:  X-rays  No new radiographs were obtained today. Previous radiographs were reviewed of the left knee and revealed complete loss of medial compartment joint space with osteophyte formation noted. Lateral compartment reveals loss of joint space with osteophyte formation. Patellofemoral joint reveals loss of joint space with osteophyte formation. No fractures noted.  Impression:   ICD-10-CM  1. Primary osteoarthritis of left knee M17.12   Plan:   The patient has end-stage degenerative changes of the left knee. It was explained to the patient that the condition is progressive in nature. Having failed conservative treatment, the patient has elected to proceed with a total joint arthroplasty. The patient will undergo a total joint arthroplasty with Dr. Marry Guan. The risks of surgery, including blood clot and infection, were discussed with the patient. Measures to reduce these risks, including the use of anticoagulation, perioperative  antibiotics, and early ambulation were discussed. The importance of postoperative physical therapy was discussed with the patient. The patient elects to proceed with surgery. The patient is instructed to stop all blood thinners prior to surgery. The patient is instructed to call the hospital the day before surgery to learn of the proper arrival time.   Contact our office with any questions or concerns. Follow up as indicated, or sooner should any new problems arise, if conditions worsen, or if they are otherwise concerned.   Gwenlyn Fudge, PA-C [facility] [address], [place] [address] Phone: [phone]  This note was generated in part with voice recognition software and I apologize for any typographical errors that were not detected and corrected.

## 2019-08-20 ENCOUNTER — Encounter: Payer: Self-pay | Admitting: Orthopedic Surgery

## 2019-08-20 ENCOUNTER — Encounter
Admission: RE | Disposition: A | Discharge status: Home / Self Care | Payer: Self-pay | Source: Home / Self Care | Attending: Orthopedic Surgery

## 2019-08-20 ENCOUNTER — Inpatient Hospital Stay: Payer: Medicare Other

## 2019-08-20 ENCOUNTER — Other Ambulatory Visit: Payer: Self-pay

## 2019-08-20 ENCOUNTER — Inpatient Hospital Stay
Admission: RE | Admit: 2019-08-20 | Discharge: 2019-08-21 | DRG: 470 | Disposition: A | Discharge status: Home / Self Care | Payer: Medicare Other | Attending: Orthopedic Surgery | Admitting: Orthopedic Surgery

## 2019-08-20 ENCOUNTER — Inpatient Hospital Stay: Payer: Medicare Other | Admitting: Anesthesiology

## 2019-08-20 DIAGNOSIS — Z955 Presence of coronary angioplasty implant and graft: Secondary | ICD-10-CM | POA: Diagnosis not present

## 2019-08-20 DIAGNOSIS — Z7982 Long term (current) use of aspirin: Secondary | ICD-10-CM

## 2019-08-20 DIAGNOSIS — E669 Obesity, unspecified: Secondary | ICD-10-CM | POA: Diagnosis present

## 2019-08-20 DIAGNOSIS — I2510 Atherosclerotic heart disease of native coronary artery without angina pectoris: Secondary | ICD-10-CM | POA: Diagnosis not present

## 2019-08-20 DIAGNOSIS — Z6835 Body mass index (BMI) 35.0-35.9, adult: Secondary | ICD-10-CM | POA: Diagnosis not present

## 2019-08-20 DIAGNOSIS — M1712 Unilateral primary osteoarthritis, left knee: Secondary | ICD-10-CM | POA: Diagnosis not present

## 2019-08-20 DIAGNOSIS — E785 Hyperlipidemia, unspecified: Secondary | ICD-10-CM | POA: Diagnosis not present

## 2019-08-20 DIAGNOSIS — F17290 Nicotine dependence, other tobacco product, uncomplicated: Secondary | ICD-10-CM | POA: Diagnosis present

## 2019-08-20 DIAGNOSIS — F17210 Nicotine dependence, cigarettes, uncomplicated: Secondary | ICD-10-CM | POA: Diagnosis present

## 2019-08-20 DIAGNOSIS — E782 Mixed hyperlipidemia: Secondary | ICD-10-CM | POA: Diagnosis not present

## 2019-08-20 DIAGNOSIS — I10 Essential (primary) hypertension: Secondary | ICD-10-CM | POA: Diagnosis present

## 2019-08-20 DIAGNOSIS — Z79899 Other long term (current) drug therapy: Secondary | ICD-10-CM

## 2019-08-20 DIAGNOSIS — Z96652 Presence of left artificial knee joint: Secondary | ICD-10-CM

## 2019-08-20 DIAGNOSIS — Z96659 Presence of unspecified artificial knee joint: Secondary | ICD-10-CM | POA: Diagnosis present

## 2019-08-20 DIAGNOSIS — Z96642 Presence of left artificial hip joint: Secondary | ICD-10-CM | POA: Diagnosis not present

## 2019-08-20 HISTORY — PX: KNEE ARTHROPLASTY: SHX992

## 2019-08-20 LAB — ABO/RH: ABO/RH(D): B POS

## 2019-08-20 SURGERY — ARTHROPLASTY, KNEE, TOTAL, USING IMAGELESS COMPUTER-ASSISTED NAVIGATION
Anesthesia: Spinal | Site: Knee | Laterality: Left

## 2019-08-20 MED ORDER — NEOMYCIN-POLYMYXIN B GU 40-200000 IR SOLN
Status: AC
  Filled 2019-08-20: qty 20

## 2019-08-20 MED ORDER — DEXAMETHASONE SODIUM PHOSPHATE 10 MG/ML IJ SOLN: 8.0000 mg | Freq: Once | INTRAMUSCULAR | Status: AC

## 2019-08-20 MED ORDER — PROPOFOL 500 MG/50ML IV EMUL
INTRAVENOUS | Status: DC | PRN
  Administered 2019-08-20: 60 ug/kg/min via INTRAVENOUS

## 2019-08-20 MED ORDER — ACETAMINOPHEN 325 MG PO TABS: 325.0000 mg | ORAL_TABLET | Freq: Four times a day (QID) | ORAL | Status: DC | PRN

## 2019-08-20 MED ORDER — CELECOXIB 200 MG PO CAPS
200.0000 mg | ORAL_CAPSULE | Freq: Two times a day (BID) | ORAL | Status: DC
  Administered 2019-08-20 – 2019-08-21 (×2): 200 mg via ORAL
  Filled 2019-08-20 (×2): qty 1

## 2019-08-20 MED ORDER — ONDANSETRON HCL 4 MG/2ML IJ SOLN: 4.0000 mg | Freq: Four times a day (QID) | INTRAMUSCULAR | Status: DC | PRN

## 2019-08-20 MED ORDER — LISINOPRIL 20 MG PO TABS
40.0000 mg | ORAL_TABLET | Freq: Every day | ORAL | Status: DC
  Administered 2019-08-20 – 2019-08-21 (×2): 40 mg via ORAL
  Filled 2019-08-20 (×3): qty 2

## 2019-08-20 MED ORDER — SENNOSIDES-DOCUSATE SODIUM 8.6-50 MG PO TABS
1.0000 | ORAL_TABLET | Freq: Two times a day (BID) | ORAL | Status: DC
  Administered 2019-08-21: 1 via ORAL
  Filled 2019-08-20: qty 1

## 2019-08-20 MED ORDER — LISINOPRIL-HYDROCHLOROTHIAZIDE 20-25 MG PO TABS: 2.0000 | ORAL_TABLET | Freq: Every day | ORAL | Status: DC

## 2019-08-20 MED ORDER — MENTHOL 3 MG MT LOZG
1.0000 | LOZENGE | OROMUCOSAL | Status: DC | PRN
  Filled 2019-08-20: qty 9

## 2019-08-20 MED ORDER — TRAMADOL HCL 50 MG PO TABS: 50.0000 mg | ORAL_TABLET | ORAL | Status: DC | PRN

## 2019-08-20 MED ORDER — PROPOFOL 500 MG/50ML IV EMUL
INTRAVENOUS | Status: AC
  Filled 2019-08-20: qty 50

## 2019-08-20 MED ORDER — TRANEXAMIC ACID-NACL 1000-0.7 MG/100ML-% IV SOLN
INTRAVENOUS | Status: AC
  Filled 2019-08-20: qty 100

## 2019-08-20 MED ORDER — MAGNESIUM HYDROXIDE 400 MG/5ML PO SUSP
30.0000 mL | Freq: Every day | ORAL | Status: DC
  Administered 2019-08-20 – 2019-08-21 (×2): 30 mL via ORAL
  Filled 2019-08-20 (×3): qty 30

## 2019-08-20 MED ORDER — GLYCOPYRROLATE 0.2 MG/ML IJ SOLN
INTRAMUSCULAR | Status: DC | PRN
  Administered 2019-08-20: .2 mg via INTRAVENOUS

## 2019-08-20 MED ORDER — GABAPENTIN 300 MG PO CAPS
ORAL_CAPSULE | ORAL | Status: AC
  Administered 2019-08-20: 300 mg via ORAL
  Filled 2019-08-20: qty 1

## 2019-08-20 MED ORDER — GABAPENTIN 300 MG PO CAPS: 300.0000 mg | ORAL_CAPSULE | Freq: Once | ORAL | Status: AC

## 2019-08-20 MED ORDER — METOCLOPRAMIDE HCL 10 MG PO TABS
10.0000 mg | ORAL_TABLET | Freq: Three times a day (TID) | ORAL | Status: DC
  Administered 2019-08-20 – 2019-08-21 (×4): 10 mg via ORAL
  Filled 2019-08-20 (×4): qty 1

## 2019-08-20 MED ORDER — LACTATED RINGERS IV SOLN: INTRAVENOUS | Status: DC

## 2019-08-20 MED ORDER — SODIUM CHLORIDE 0.9 % IV SOLN: INTRAVENOUS | Status: DC

## 2019-08-20 MED ORDER — KETAMINE HCL 50 MG/ML IJ SOLN
INTRAMUSCULAR | Status: DC | PRN
  Administered 2019-08-20 (×3): 25 mg via INTRAMUSCULAR

## 2019-08-20 MED ORDER — GABAPENTIN 300 MG PO CAPS
300.0000 mg | ORAL_CAPSULE | Freq: Every day | ORAL | Status: DC
  Administered 2019-08-20: 300 mg via ORAL
  Filled 2019-08-20: qty 1

## 2019-08-20 MED ORDER — KETAMINE HCL 50 MG/ML IJ SOLN
INTRAMUSCULAR | Status: AC
  Filled 2019-08-20: qty 10

## 2019-08-20 MED ORDER — TRANEXAMIC ACID-NACL 1000-0.7 MG/100ML-% IV SOLN: 1000.0000 mg | Freq: Once | INTRAVENOUS | Status: AC

## 2019-08-20 MED ORDER — BUPIVACAINE HCL (PF) 0.5 % IJ SOLN
INTRAMUSCULAR | Status: DC | PRN
  Administered 2019-08-20: 3 mL via INTRATHECAL

## 2019-08-20 MED ORDER — SODIUM CHLORIDE 0.9 % IV SOLN
INTRAVENOUS | Status: DC | PRN
  Administered 2019-08-20: 15 ug/min via INTRAVENOUS

## 2019-08-20 MED ORDER — ORAL CARE MOUTH RINSE: 15.0000 mL | Freq: Once | OROMUCOSAL | Status: AC

## 2019-08-20 MED ORDER — CHLORHEXIDINE GLUCONATE 0.12 % MT SOLN: 15.0000 mL | Freq: Once | OROMUCOSAL | Status: AC

## 2019-08-20 MED ORDER — NEOMYCIN-POLYMYXIN B GU 40-200000 IR SOLN
Status: DC | PRN
  Administered 2019-08-20: 14 mL

## 2019-08-20 MED ORDER — METOCLOPRAMIDE HCL 10 MG PO TABS: 5.0000 mg | ORAL_TABLET | Freq: Three times a day (TID) | ORAL | Status: DC | PRN

## 2019-08-20 MED ORDER — PHENOL 1.4 % MT LIQD
1.0000 | OROMUCOSAL | Status: DC | PRN
  Filled 2019-08-20: qty 177

## 2019-08-20 MED ORDER — OXYCODONE HCL 5 MG PO TABS
10.0000 mg | ORAL_TABLET | ORAL | Status: DC | PRN
  Administered 2019-08-20 – 2019-08-21 (×2): 10 mg via ORAL
  Filled 2019-08-20: qty 2

## 2019-08-20 MED ORDER — CEFAZOLIN SODIUM-DEXTROSE 2-4 GM/100ML-% IV SOLN
INTRAVENOUS | Status: AC
  Filled 2019-08-20: qty 100

## 2019-08-20 MED ORDER — ENSURE PRE-SURGERY PO LIQD
296.0000 mL | Freq: Once | ORAL | Status: DC
  Filled 2019-08-20: qty 296

## 2019-08-20 MED ORDER — BUPIVACAINE HCL (PF) 0.25 % IJ SOLN
INTRAMUSCULAR | Status: DC | PRN
  Administered 2019-08-20: 60 mL

## 2019-08-20 MED ORDER — SENNOSIDES-DOCUSATE SODIUM 8.6-50 MG PO TABS
1.0000 | ORAL_TABLET | Freq: Two times a day (BID) | ORAL | Status: DC
  Administered 2019-08-20: 1 via ORAL
  Filled 2019-08-20: qty 1

## 2019-08-20 MED ORDER — BUPIVACAINE HCL (PF) 0.25 % IJ SOLN
INTRAMUSCULAR | Status: AC
  Filled 2019-08-20: qty 60

## 2019-08-20 MED ORDER — MIDAZOLAM HCL 2 MG/2ML IJ SOLN
INTRAMUSCULAR | Status: AC
  Filled 2019-08-20: qty 2

## 2019-08-20 MED ORDER — CELECOXIB 200 MG PO CAPS
ORAL_CAPSULE | ORAL | Status: AC
  Administered 2019-08-20: 400 mg via ORAL
  Filled 2019-08-20: qty 2

## 2019-08-20 MED ORDER — FENTANYL CITRATE (PF) 100 MCG/2ML IJ SOLN
25.0000 ug | INTRAMUSCULAR | Status: DC | PRN
  Administered 2019-08-20: 50 ug via INTRAVENOUS

## 2019-08-20 MED ORDER — ALUM & MAG HYDROXIDE-SIMETH 200-200-20 MG/5ML PO SUSP: 30.0000 mL | ORAL | Status: DC | PRN

## 2019-08-20 MED ORDER — ONDANSETRON HCL 4 MG/2ML IJ SOLN: 4.0000 mg | Freq: Once | INTRAMUSCULAR | Status: DC | PRN

## 2019-08-20 MED ORDER — FAMOTIDINE 20 MG PO TABS: 20.0000 mg | ORAL_TABLET | Freq: Once | ORAL | Status: AC

## 2019-08-20 MED ORDER — METOCLOPRAMIDE HCL 5 MG/ML IJ SOLN: 5.0000 mg | Freq: Three times a day (TID) | INTRAMUSCULAR | Status: DC | PRN

## 2019-08-20 MED ORDER — BUPIVACAINE LIPOSOME 1.3 % IJ SUSP
INTRAMUSCULAR | Status: AC
  Filled 2019-08-20: qty 20

## 2019-08-20 MED ORDER — ONDANSETRON HCL 4 MG PO TABS: 4.0000 mg | ORAL_TABLET | Freq: Four times a day (QID) | ORAL | Status: DC | PRN

## 2019-08-20 MED ORDER — TRANEXAMIC ACID-NACL 1000-0.7 MG/100ML-% IV SOLN
INTRAVENOUS | Status: AC
  Administered 2019-08-20: 1000 mg via INTRAVENOUS
  Filled 2019-08-20: qty 100

## 2019-08-20 MED ORDER — FAMOTIDINE 20 MG PO TABS
ORAL_TABLET | ORAL | Status: AC
  Administered 2019-08-20: 20 mg via ORAL
  Filled 2019-08-20: qty 1

## 2019-08-20 MED ORDER — ACETAMINOPHEN 10 MG/ML IV SOLN
1000.0000 mg | Freq: Four times a day (QID) | INTRAVENOUS | Status: AC
  Administered 2019-08-20 – 2019-08-21 (×4): 1000 mg via INTRAVENOUS
  Filled 2019-08-20 (×4): qty 100

## 2019-08-20 MED ORDER — CEFAZOLIN SODIUM-DEXTROSE 2-4 GM/100ML-% IV SOLN
2.0000 g | INTRAVENOUS | Status: AC
  Administered 2019-08-20: 2 g via INTRAVENOUS

## 2019-08-20 MED ORDER — HYDROMORPHONE HCL 1 MG/ML IJ SOLN: 0.5000 mg | INTRAMUSCULAR | Status: DC | PRN

## 2019-08-20 MED ORDER — AMLODIPINE BESYLATE 10 MG PO TABS
10.0000 mg | ORAL_TABLET | Freq: Every day | ORAL | Status: DC
  Administered 2019-08-20 – 2019-08-21 (×2): 10 mg via ORAL
  Filled 2019-08-20 (×3): qty 1

## 2019-08-20 MED ORDER — CHLORHEXIDINE GLUCONATE 0.12 % MT SOLN
OROMUCOSAL | Status: AC
  Administered 2019-08-20: 15 mL via OROMUCOSAL
  Filled 2019-08-20: qty 15

## 2019-08-20 MED ORDER — FERROUS SULFATE 325 (65 FE) MG PO TABS
325.0000 mg | ORAL_TABLET | Freq: Two times a day (BID) | ORAL | Status: DC
  Administered 2019-08-20 – 2019-08-21 (×2): 325 mg via ORAL
  Filled 2019-08-20 (×3): qty 1

## 2019-08-20 MED ORDER — PANTOPRAZOLE SODIUM 40 MG PO TBEC
40.0000 mg | DELAYED_RELEASE_TABLET | Freq: Two times a day (BID) | ORAL | Status: DC
  Administered 2019-08-20: 40 mg via ORAL
  Filled 2019-08-20 (×2): qty 1

## 2019-08-20 MED ORDER — BISACODYL 10 MG RE SUPP
10.0000 mg | Freq: Every day | RECTAL | Status: DC | PRN
  Filled 2019-08-20: qty 1

## 2019-08-20 MED ORDER — MIDAZOLAM HCL 5 MG/5ML IJ SOLN
INTRAMUSCULAR | Status: DC | PRN
  Administered 2019-08-20: 2 mg via INTRAVENOUS

## 2019-08-20 MED ORDER — SODIUM CHLORIDE 0.9 % IV SOLN
INTRAVENOUS | Status: DC | PRN
  Administered 2019-08-20: 60 mL

## 2019-08-20 MED ORDER — DEXAMETHASONE SODIUM PHOSPHATE 10 MG/ML IJ SOLN
INTRAMUSCULAR | Status: AC
  Administered 2019-08-20: 8 mg via INTRAVENOUS
  Filled 2019-08-20: qty 1

## 2019-08-20 MED ORDER — PHENYLEPHRINE HCL (PRESSORS) 10 MG/ML IV SOLN
INTRAVENOUS | Status: DC | PRN
  Administered 2019-08-20 (×4): 100 ug via INTRAVENOUS

## 2019-08-20 MED ORDER — CEFAZOLIN SODIUM-DEXTROSE 2-4 GM/100ML-% IV SOLN
INTRAVENOUS | Status: AC
  Administered 2019-08-20: 2 g via INTRAVENOUS
  Filled 2019-08-20: qty 100

## 2019-08-20 MED ORDER — CEFAZOLIN SODIUM-DEXTROSE 2-4 GM/100ML-% IV SOLN
2.0000 g | Freq: Four times a day (QID) | INTRAVENOUS | Status: AC
  Administered 2019-08-20 – 2019-08-21 (×3): 2 g via INTRAVENOUS
  Filled 2019-08-20 (×3): qty 100

## 2019-08-20 MED ORDER — ACETAMINOPHEN 10 MG/ML IV SOLN
INTRAVENOUS | Status: DC | PRN
  Administered 2019-08-20: 1000 mg via INTRAVENOUS

## 2019-08-20 MED ORDER — TRANEXAMIC ACID-NACL 1000-0.7 MG/100ML-% IV SOLN
1000.0000 mg | INTRAVENOUS | Status: AC
  Administered 2019-08-20: 1000 mg via INTRAVENOUS

## 2019-08-20 MED ORDER — ACETAMINOPHEN 10 MG/ML IV SOLN
INTRAVENOUS | Status: AC
  Filled 2019-08-20: qty 100

## 2019-08-20 MED ORDER — CHLORHEXIDINE GLUCONATE 4 % EX LIQD: 60.0000 mL | Freq: Once | CUTANEOUS | Status: DC

## 2019-08-20 MED ORDER — SODIUM CHLORIDE FLUSH 0.9 % IV SOLN
INTRAVENOUS | Status: AC
  Filled 2019-08-20: qty 40

## 2019-08-20 MED ORDER — OXYCODONE HCL 5 MG PO TABS
5.0000 mg | ORAL_TABLET | ORAL | Status: DC | PRN
  Filled 2019-08-20: qty 1

## 2019-08-20 MED ORDER — ATORVASTATIN CALCIUM 20 MG PO TABS
40.0000 mg | ORAL_TABLET | Freq: Every day | ORAL | Status: DC
  Administered 2019-08-20 – 2019-08-21 (×2): 40 mg via ORAL
  Filled 2019-08-20 (×3): qty 2

## 2019-08-20 MED ORDER — FLEET ENEMA 7-19 GM/118ML RE ENEM: 1.0000 | ENEMA | Freq: Once | RECTAL | Status: DC | PRN

## 2019-08-20 MED ORDER — OXYCODONE HCL 5 MG PO TABS
ORAL_TABLET | ORAL | Status: AC
  Filled 2019-08-20: qty 2

## 2019-08-20 MED ORDER — HYDROCHLOROTHIAZIDE 25 MG PO TABS
50.0000 mg | ORAL_TABLET | Freq: Every day | ORAL | Status: DC
  Administered 2019-08-20 – 2019-08-21 (×2): 50 mg via ORAL
  Filled 2019-08-20 (×3): qty 2

## 2019-08-20 MED ORDER — CELECOXIB 200 MG PO CAPS: 400.0000 mg | ORAL_CAPSULE | Freq: Once | ORAL | Status: AC

## 2019-08-20 MED ORDER — ENOXAPARIN SODIUM 30 MG/0.3ML ~~LOC~~ SOLN
30.0000 mg | Freq: Two times a day (BID) | SUBCUTANEOUS | Status: DC
  Administered 2019-08-21 (×2): 30 mg via SUBCUTANEOUS
  Filled 2019-08-20 (×3): qty 0.3

## 2019-08-20 MED ORDER — DIPHENHYDRAMINE HCL 12.5 MG/5ML PO ELIX
12.5000 mg | ORAL_SOLUTION | ORAL | Status: DC | PRN
  Filled 2019-08-20: qty 10

## 2019-08-20 MED ORDER — FENTANYL CITRATE (PF) 100 MCG/2ML IJ SOLN
INTRAMUSCULAR | Status: AC
  Administered 2019-08-20: 50 ug via INTRAVENOUS
  Filled 2019-08-20: qty 2

## 2019-08-20 MED ORDER — PHENYLEPHRINE HCL (PRESSORS) 10 MG/ML IV SOLN
INTRAVENOUS | Status: AC
  Filled 2019-08-20: qty 1

## 2019-08-20 MED ORDER — PANTOPRAZOLE SODIUM 40 MG PO TBEC
40.0000 mg | DELAYED_RELEASE_TABLET | Freq: Two times a day (BID) | ORAL | Status: DC
  Administered 2019-08-21: 40 mg via ORAL
  Filled 2019-08-20: qty 1

## 2019-08-20 MED ORDER — BUPIVACAINE HCL (PF) 0.5 % IJ SOLN
INTRAMUSCULAR | Status: AC
  Filled 2019-08-20: qty 10

## 2019-08-20 SURGICAL SUPPLY — 76 items
ATTUNE MED DOME PAT 41 KNEE (Knees) ×1 IMPLANT
ATTUNE MED DOME PAT 41MM KNEE (Knees) ×1 IMPLANT
ATTUNE PS FEM LT SZ 7 CEM KNEE (Femur) ×2 IMPLANT
ATTUNE PSRP INSR SZ7 5 KNEE (Insert) ×1 IMPLANT
ATTUNE PSRP INSR SZ7 5MM KNEE (Insert) ×1 IMPLANT
BASE TIBIAL ROT PLAT SZ 8 KNEE (Knees) IMPLANT
BATTERY INSTRU NAVIGATION (MISCELLANEOUS) ×12 IMPLANT
BLADE SAW 70X12.5 (BLADE) ×3 IMPLANT
BLADE SAW 90X13X1.19 OSCILLAT (BLADE) ×3 IMPLANT
BLADE SAW 90X25X1.19 OSCILLAT (BLADE) ×3 IMPLANT
CANISTER SUCT 3000ML PPV (MISCELLANEOUS) ×3 IMPLANT
CEMENT HV SMART SET (Cement) ×4 IMPLANT
COOLER POLAR GLACIER W/PUMP (MISCELLANEOUS) ×3 IMPLANT
COVER WAND RF STERILE (DRAPES) ×3 IMPLANT
CUFF TOURN SGL QUICK 24 (TOURNIQUET CUFF)
CUFF TOURN SGL QUICK 30 (TOURNIQUET CUFF) ×2
CUFF TRNQT CYL 24X4X16.5-23 (TOURNIQUET CUFF) IMPLANT
CUFF TRNQT CYL 30X4X21-28X (TOURNIQUET CUFF) IMPLANT
DRAPE 3/4 80X56 (DRAPES) ×3 IMPLANT
DRSG DERMACEA 8X12 NADH (GAUZE/BANDAGES/DRESSINGS) ×3 IMPLANT
DRSG OPSITE POSTOP 4X14 (GAUZE/BANDAGES/DRESSINGS) ×3 IMPLANT
DRSG TEGADERM 4X4.75 (GAUZE/BANDAGES/DRESSINGS) ×3 IMPLANT
DURAPREP 26ML APPLICATOR (WOUND CARE) ×6 IMPLANT
ELECT REM PT RETURN 9FT ADLT (ELECTROSURGICAL) ×3
ELECTRODE REM PT RTRN 9FT ADLT (ELECTROSURGICAL) ×1 IMPLANT
EX-PIN ORTHOLOCK NAV 4X150 (PIN) ×6 IMPLANT
GLOVE BIO SURGEON STRL SZ7.5 (GLOVE) ×6 IMPLANT
GLOVE BIOGEL M STRL SZ7.5 (GLOVE) ×6 IMPLANT
GLOVE BIOGEL PI IND STRL 7.5 (GLOVE) ×1 IMPLANT
GLOVE BIOGEL PI INDICATOR 7.5 (GLOVE) ×2
GLOVE INDICATOR 8.0 STRL GRN (GLOVE) ×3 IMPLANT
GOWN STRL REUS W/ TWL LRG LVL3 (GOWN DISPOSABLE) ×2 IMPLANT
GOWN STRL REUS W/ TWL XL LVL3 (GOWN DISPOSABLE) ×1 IMPLANT
GOWN STRL REUS W/TWL LRG LVL3 (GOWN DISPOSABLE) ×4
GOWN STRL REUS W/TWL XL LVL3 (GOWN DISPOSABLE) ×2
HEMOVAC 400CC 10FR (MISCELLANEOUS) ×3 IMPLANT
HOLDER FOLEY CATH W/STRAP (MISCELLANEOUS) ×3 IMPLANT
HOOD PEEL AWAY FLYTE STAYCOOL (MISCELLANEOUS) ×6 IMPLANT
KIT TURNOVER KIT A (KITS) ×3 IMPLANT
KNIFE SCULPS 14X20 (INSTRUMENTS) ×3 IMPLANT
LABEL OR SOLS (LABEL) ×3 IMPLANT
MANIFOLD NEPTUNE II (INSTRUMENTS) ×3 IMPLANT
NDL SAFETY ECLIPSE 18X1.5 (NEEDLE) ×1 IMPLANT
NDL SPNL 20GX3.5 QUINCKE YW (NEEDLE) ×2 IMPLANT
NEEDLE HYPO 18GX1.5 SHARP (NEEDLE) ×2
NEEDLE SPNL 20GX3.5 QUINCKE YW (NEEDLE) ×6 IMPLANT
NS IRRIG 500ML POUR BTL (IV SOLUTION) ×3 IMPLANT
PACK TOTAL KNEE (MISCELLANEOUS) ×3 IMPLANT
PAD WRAPON POLAR KNEE (MISCELLANEOUS) ×1 IMPLANT
PENCIL SMOKE EVACUATOR COATED (MISCELLANEOUS) ×1 IMPLANT
PENCIL SMOKE ULTRAEVAC 22 CON (MISCELLANEOUS) ×3 IMPLANT
PIN DRILL QUICK PACK ×3 IMPLANT
PIN FIXATION 1/8DIA X 3INL (PIN) ×9 IMPLANT
PULSAVAC PLUS IRRIG FAN TIP (DISPOSABLE) ×3
SOL .9 NS 3000ML IRR  AL (IV SOLUTION) ×2
SOL .9 NS 3000ML IRR UROMATIC (IV SOLUTION) ×1 IMPLANT
SOL PREP PVP 2OZ (MISCELLANEOUS) ×3
SOLUTION PREP PVP 2OZ (MISCELLANEOUS) ×1 IMPLANT
SPONGE DRAIN TRACH 4X4 STRL 2S (GAUZE/BANDAGES/DRESSINGS) ×3 IMPLANT
SPONGE LAP 18X18 RF (DISPOSABLE) ×2 IMPLANT
STAPLER SKIN PROX 35W (STAPLE) ×3 IMPLANT
STOCKINETTE IMPERV 14X48 (MISCELLANEOUS) IMPLANT
STRAP TIBIA SHORT (MISCELLANEOUS) ×3 IMPLANT
SUCTION FRAZIER HANDLE 10FR (MISCELLANEOUS) ×2
SUCTION TUBE FRAZIER 10FR DISP (MISCELLANEOUS) ×1 IMPLANT
SUT VIC AB 0 CT1 36 (SUTURE) ×6 IMPLANT
SUT VIC AB 1 CT1 36 (SUTURE) ×6 IMPLANT
SUT VIC AB 2-0 CT2 27 (SUTURE) ×3 IMPLANT
SYR 20ML LL LF (SYRINGE) ×3 IMPLANT
SYR 30ML LL (SYRINGE) ×6 IMPLANT
TIBIAL BASE ROT PLAT SZ 8 KNEE (Knees) ×3 IMPLANT
TIP FAN IRRIG PULSAVAC PLUS (DISPOSABLE) ×1 IMPLANT
TOWEL OR 17X26 4PK STRL BLUE (TOWEL DISPOSABLE) ×3 IMPLANT
TOWER CARTRIDGE SMART MIX (DISPOSABLE) ×3 IMPLANT
TRAY FOLEY MTR SLVR 16FR STAT (SET/KITS/TRAYS/PACK) ×3 IMPLANT
WRAPON POLAR PAD KNEE (MISCELLANEOUS) ×3

## 2019-08-20 NOTE — H&P (Signed)
The patient has been re-examined, and the chart reviewed, and there have been no interval changes to the documented history and physical.    The risks, benefits, and alternatives have been discussed at length. The patient expressed understanding of the risks benefits and agreed with plans for surgical intervention.  Sederick Jacobsen P. Amond Speranza, Jr. M.D.    

## 2019-08-20 NOTE — Evaluation (Addendum)
Physical Therapy Evaluation Patient Details Name: Mario Wilson MRN: [id number] DOB: 04-28-1950 Today's Date: 08/20/2019   History of Present Illness  Degenerative arthrosis of the left knee s/p left total knee arthroplasty 08/20/19. History of arthritis, HTN, coronary artery disease, current smoker, obesity.    Clinical Impression  PT evaluation completed. Patient has minimal pain in left knee following surgery and is motivated to participate with therapy. Patient was independent and active before surgery and has supportive family. Patient is able to complete SLR with LLE x 10 reps without difficulty therefore knee immobilizer not applied. Patient performing transfers with Min guard with cues for safety and technique. Patient ambulated a short distance in the room with rolling walker with Min guard assistance. Therapeutic exercises performed while supine in bed. Recommend PT follow up following TKA to maximize function and safety in preparation for discharge home with family.     Follow Up Recommendations Home health PT    Equipment Recommendations  Rolling walker with 5" wheels    Recommendations for Other Services       Precautions / Restrictions Precautions Precautions: Fall;Knee Precaution Booklet Issued: Yes (comment) Restrictions Weight Bearing Restrictions: Yes LLE Weight Bearing: Weight bearing as tolerated      Mobility  Bed Mobility Overal bed mobility: Needs Assistance Bed Mobility: Supine to Sit     Supine to sit: Supervision     General bed mobility comments: supervision for safety and verbal cues for technique   Transfers Overall transfer level: Needs assistance Equipment used: Rolling walker (2 wheeled) Transfers: Sit to/from Stand Sit to Stand: Min guard         General transfer comment: verbal cues for LLE positioning with transfers. Min guard for safety   Ambulation/Gait Ambulation/Gait assistance: Min guard Gait Distance (Feet): 5 Feet Assistive  device: Rolling walker (2 wheeled) Gait Pattern/deviations: Step-to pattern;Decreased stance time - left;Decreased step length - left;Antalgic Gait velocity: decreased    General Gait Details: no loss of balance with ambulation in room using rolling walker. verabl cues for safety   Stairs            Wheelchair Mobility    Modified Rankin (Stroke Patients Only)       Balance Overall balance assessment: Needs assistance Sitting-balance support: Feet supported;No upper extremity supported Sitting balance-Leahy Scale: Good     Standing balance support: Bilateral upper extremity supported Standing balance-Leahy Scale: Fair                               Pertinent Vitals/Pain Pain Assessment: 0-10 Pain Score: 2  Pain Location: left knee  Pain Descriptors / Indicators: Sore Pain Intervention(s): Limited activity within patient's tolerance;Repositioned;Monitored during session (polar care re-applied post session )    Home Living Family/patient expects to be discharged to:: Private residence Living Arrangements: Other relatives Available Help at Discharge: Family;Available PRN/intermittently Type of Home: House Home Access:  (2 small steps without rails )     Home Layout: Able to live on main level with bedroom/bathroom Home Equipment: None      Prior Function Level of Independence: Independent         Comments: patient is very active at baseline, enjoys golf and yard work      Higher education careers adviser        Extremity/Trunk Assessment   Upper Extremity Assessment Upper Extremity Assessment: Overall WFL for tasks assessed    Lower Extremity Assessment Lower Extremity Assessment: LLE deficits/detail (  RLE WNL ) LLE Deficits / Details: dorsiflexion, plantarflexion 5/5, SLR independently in supine position. patient able activate and hold quad set. no knee buckling in standing position  LLE Sensation: WNL       Communication   Communication: No  difficulties  Cognition Arousal/Alertness: Awake/alert Behavior During Therapy: WFL for tasks assessed/performed Overall Cognitive Status: Within Functional Limits for tasks assessed                                        General Comments      Exercises Total Joint Exercises Ankle Circles/Pumps: AROM;Strengthening;10 reps;Supine;Left Quad Sets: AROM;Strengthening;Left;10 reps;Supine Heel Slides: AROM;Left;Strengthening;10 reps;Supine Straight Leg Raises: AROM;Left;Strengthening;10 reps;Supine (patient able to perform SLR independently ) Goniometric ROM: left knee AROM flexion 81 degrees measured in sitting position. left knee extension lacking 5 degrees to full extension measured in supine position.    Assessment/Plan    PT Assessment Patient needs continued PT services  PT Problem List Decreased strength;Decreased range of motion;Decreased activity tolerance;Decreased balance;Decreased mobility;Decreased knowledge of use of DME;Decreased safety awareness       PT Treatment Interventions DME instruction;Gait training;Stair training;Functional mobility training;Therapeutic activities;Therapeutic exercise;Balance training    PT Goals (Current goals can be found in the Care Plan section)  Acute Rehab PT Goals Patient Stated Goal: to walk and go home  PT Goal Formulation: With patient/family Time For Goal Achievement: 09/03/19 Potential to Achieve Goals: Good    Frequency BID   Barriers to discharge        Co-evaluation               AM-PAC PT "6 Clicks" Mobility  Outcome Measure Help needed turning from your back to your side while in a flat bed without using bedrails?: A Little Help needed moving from lying on your back to sitting on the side of a flat bed without using bedrails?: A Little Help needed moving to and from a bed to a chair (including a wheelchair)?: A Little Help needed standing up from a chair using your arms (e.g., wheelchair or  bedside chair)?: A Little Help needed to walk in hospital room?: A Little Help needed climbing 3-5 steps with a railing? : A Little 6 Click Score: 18    End of Session Equipment Utilized During Treatment: Gait belt Activity Tolerance: Patient tolerated treatment well Patient left: in chair;with call bell/phone within reach;with chair alarm set;with family/visitor present;with nursing/sitter in room;with SCD's reapplied (polar care reapplied to left knee ) Nurse Communication: Mobility status PT Visit Diagnosis: Unsteadiness on feet (R26.81);Other abnormalities of gait and mobility (R26.89)    Time: 0300-9233 PT Time Calculation (min) (ACUTE ONLY): 33 min   Charges:   PT Evaluation $PT Eval Moderate Complexity: 1 Mod PT Treatments $Therapeutic Exercise: 8-22 mins        Minna Merritts, PT, MPT  Percell Locus 08/20/2019, 4:12 PM

## 2019-08-20 NOTE — Transfer of Care (Signed)
Immediate Anesthesia Transfer of Care Note  Patient: Mario Wilson  Procedure(s) Performed: COMPUTER ASSISTED TOTAL KNEE ARTHROPLASTY (Left Knee)  Patient Location: PACU  Anesthesia Type:Spinal  Level of Consciousness: awake  Airway & Oxygen Therapy: Patient connected to face mask oxygen  Post-op Assessment: Post -op Vital signs reviewed and stable  Post vital signs: stable  Last Vitals:  Vitals Value Taken Time  BP    Temp    Pulse 70 08/20/19 1100  Resp    SpO2 96 % 08/20/19 1100  Vitals shown include unvalidated device data.  Last Pain:  Vitals:   08/20/19 0627  TempSrc: Tympanic  PainSc: 0-No pain         Complications: No complications documented.

## 2019-08-20 NOTE — Op Note (Signed)
OPERATIVE NOTE  DATE OF SURGERY:  08/20/2019  PATIENT NAME:  Mario Wilson   DOB: [birth year]  MRN: [id number]  PRE-OPERATIVE DIAGNOSIS: Degenerative arthrosis of the left knee, primary  POST-OPERATIVE DIAGNOSIS:  Same  PROCEDURE:  Left total knee arthroplasty using computer-assisted navigation  SURGEON:  Marciano Sequin. M.D.  ASSISTANT: Cassell Smiles, PA-C (present and scrubbed throughout the case, critical for assistance with exposure, retraction, instrumentation, and closure)  ANESTHESIA: spinal  ESTIMATED BLOOD LOSS: 50 mL  FLUIDS REPLACED: 1200 mL of crystalloid  TOURNIQUET TIME: 92 minutes  DRAINS: 2 medium Hemovac drains  SOFT TISSUE RELEASES: Anterior cruciate ligament, posterior cruciate ligament, deep medial collateral ligament, patellofemoral ligament  IMPLANTS UTILIZED: DePuy Attune size 7 posterior stabilized femoral component (cemented), size 8 rotating platform tibial component (cemented), 41 mm medialized dome patella (cemented), and a 5 mm stabilized rotating platform polyethylene insert.  INDICATIONS FOR SURGERY: Mario Wilson is a 69 y.o. year old male with a long history of progressive knee pain. X-rays demonstrated severe degenerative changes in tricompartmental fashion. The patient had not seen any significant improvement despite conservative nonsurgical intervention. After discussion of the risks and benefits of surgical intervention, the patient expressed understanding of the risks benefits and agree with plans for total knee arthroplasty.   The risks, benefits, and alternatives were discussed at length including but not limited to the risks of infection, bleeding, nerve injury, stiffness, blood clots, the need for revision surgery, cardiopulmonary complications, among others, and they were willing to proceed.  PROCEDURE IN DETAIL: The patient was brought into the operating room and, after adequate spinal anesthesia was achieved, a tourniquet was placed on the  patient's upper thigh. The patient's knee and leg were cleaned and prepped with alcohol and DuraPrep and draped in the usual sterile fashion. A "timeout" was performed as per usual protocol. The lower extremity was exsanguinated using an Esmarch, and the tourniquet was inflated to 300 mmHg. An anterior longitudinal incision was made followed by a standard mid vastus approach. The deep fibers of the medial collateral ligament were elevated in a subperiosteal fashion off of the medial flare of the tibia so as to maintain a continuous soft tissue sleeve. The patella was subluxed laterally and the patellofemoral ligament was incised. Inspection of the knee demonstrated severe degenerative changes with full-thickness loss of articular cartilage. Osteophytes were debrided using a rongeur. Anterior and posterior cruciate ligaments were excised. Two 4.0 mm Schanz pins were inserted in the femur and into the tibia for attachment of the array of trackers used for computer-assisted navigation. Hip center was identified using a circumduction technique. Distal landmarks were mapped using the computer. The distal femur and proximal tibia were mapped using the computer. The distal femoral cutting guide was positioned using computer-assisted navigation so as to achieve a 5 distal valgus cut. The femur was sized and it was felt that a size 7 femoral component was appropriate. A size 7 femoral cutting guide was positioned and the anterior cut was performed and verified using the computer. This was followed by completion of the posterior and chamfer cuts. Femoral cutting guide for the central box was then positioned in the center box cut was performed.  Attention was then directed to the proximal tibia. Medial and lateral menisci were excised. The extramedullary tibial cutting guide was positioned using computer-assisted navigation so as to achieve a 0 varus-valgus alignment and 3 posterior slope. The cut was performed and  verified using the computer. The proximal tibia was sized and  it was felt that a size 8 tibial tray was appropriate. Tibial and femoral trials were inserted followed by insertion of a 5 mm polyethylene insert. This allowed for excellent mediolateral soft tissue balancing both in flexion and in full extension. Finally, the patella was cut and prepared so as to accommodate a 41 mm medialized dome patella. A patella trial was placed and the knee was placed through a range of motion with excellent patellar tracking appreciated. The femoral trial was removed after debridement of posterior osteophytes. The central post-hole for the tibial component was reamed followed by insertion of a keel punch. Tibial trials were then removed. Cut surfaces of bone were irrigated with copious amounts of normal saline with antibiotic solution using pulsatile lavage and then suctioned dry. Polymethylmethacrylate cement was prepared in the usual fashion using a vacuum mixer. Cement was applied to the cut surface of the proximal tibia as well as along the undersurface of a size 8 rotating platform tibial component. Tibial component was positioned and impacted into place. Excess cement was removed using Personal assistant. Cement was then applied to the cut surfaces of the femur as well as along the posterior flanges of the size 7 femoral component. The femoral component was positioned and impacted into place. Excess cement was removed using Personal assistant. A 5 mm polyethylene trial was inserted and the knee was brought into full extension with steady axial compression applied. Finally, cement was applied to the backside of a 41 mm medialized dome patella and the patellar component was positioned and patellar clamp applied. Excess cement was removed using Personal assistant. After adequate curing of the cement, the tourniquet was deflated after a total tourniquet time of 92 minutes. Hemostasis was achieved using electrocautery. The knee was  irrigated with copious amounts of normal saline with antibiotic solution using pulsatile lavage and then suctioned dry. 20 mL of 1.3% Exparel and 60 mL of 0.25% Marcaine in 40 mL of normal saline was injected along the posterior capsule, medial and lateral gutters, and along the arthrotomy site. A 5 mm stabilized rotating platform polyethylene insert was inserted and the knee was placed through a range of motion with excellent mediolateral soft tissue balancing appreciated and excellent patellar tracking noted. 2 medium drains were placed in the wound bed and brought out through separate stab incisions. The medial parapatellar portion of the incision was reapproximated using interrupted sutures of #1 Vicryl. Subcutaneous tissue was approximated in layers using first #0 Vicryl followed #2-0 Vicryl. The skin was approximated with skin staples. A sterile dressing was applied.  The patient tolerated the procedure well and was transported to the recovery room in stable condition.    Cyndra Feinberg P. Angie Fava., M.D.

## 2019-08-20 NOTE — Progress Notes (Signed)
Pt refused bone foam stating too painful and unable to tolerate, education and encouragement provided to pt, pt continues to refuse use at this time.

## 2019-08-20 NOTE — Plan of Care (Signed)

## 2019-08-20 NOTE — Anesthesia Preprocedure Evaluation (Addendum)
Anesthesia Evaluation  Patient identified by MRN, date of birth, ID band Patient awake    Reviewed: Allergy & Precautions, NPO status , Patient's Chart, lab work & pertinent test results  History of Anesthesia Complications Negative for: history of anesthetic complications  Airway Mallampati: III  TM Distance: >3 FB Neck ROM: Full    Dental  (+) Poor Dentition   Pulmonary neg sleep apnea, neg COPD, Current Smoker and Patient abstained from smoking.,    breath sounds clear to auscultation- rhonchi (-) wheezing      Cardiovascular hypertension, Pt. on medications + CAD and + Cardiac Stents (2015)  (-) Past MI and (-) CABG  Rhythm:Regular Rate:Normal - Systolic murmurs and - Diastolic murmurs    Neuro/Psych neg Seizures negative neurological ROS  negative psych ROS   GI/Hepatic negative GI ROS, Neg liver ROS,   Endo/Other  negative endocrine ROSneg diabetes  Renal/GU negative Renal ROS     Musculoskeletal  (+) Arthritis ,   Abdominal (+) + obese,   Peds  Hematology negative hematology ROS (+)   Anesthesia Other Findings Past Medical History: No date: Arthritis     Comment:  osteoarthritis No date: Hyperlipidemia No date: Hypertension   Reproductive/Obstetrics                             Lab Results  Component Value Date   WBC 5.5 08/08/2019   HGB 14.6 08/08/2019   HCT 39.9 08/08/2019   MCV 88.1 08/08/2019   PLT 149 (L) 08/08/2019    Anesthesia Physical Anesthesia Plan  ASA: III  Anesthesia Plan: Spinal   Post-op Pain Management:    Induction:   PONV Risk Score and Plan: 0 and Propofol infusion  Airway Management Planned: Natural Airway  Additional Equipment:   Intra-op Plan:   Post-operative Plan:   Informed Consent: I have reviewed the patients History and Physical, chart, labs and discussed the procedure including the risks, benefits and alternatives for the  proposed anesthesia with the patient or authorized representative who has indicated his/her understanding and acceptance.     Dental advisory given  Plan Discussed with: CRNA and Anesthesiologist  Anesthesia Plan Comments:         Anesthesia Quick Evaluation

## 2019-08-20 NOTE — Anesthesia Procedure Notes (Signed)
Spinal  Patient location during procedure: OR Start time: 08/20/2019 7:19 AM End time: 08/20/2019 7:23 AM Staffing Performed: resident/CRNA  Anesthesiologist: Emmie Niemann, MD Resident/CRNA: Aline Brochure, CRNA Preanesthetic Checklist Completed: patient identified, IV checked, site marked, risks and benefits discussed, surgical consent, monitors and equipment checked, pre-op evaluation and timeout performed Spinal Block Patient position: sitting Prep: ChloraPrep Patient monitoring: heart rate, continuous pulse ox and blood pressure Approach: midline Location: L4-5 Injection technique: single-shot Needle Needle type: Introducer and Pencil-Tip  Needle gauge: 24 G Needle length: 9 cm Additional Notes Negative paresthesia. Negative blood return. Positive free-flowing CSF. Expiration date of kit checked and confirmed. Patient tolerated procedure well, without complications.

## 2019-08-21 ENCOUNTER — Encounter: Payer: Self-pay | Admitting: Orthopedic Surgery

## 2019-08-21 DIAGNOSIS — Z96659 Presence of unspecified artificial knee joint: Secondary | ICD-10-CM | POA: Diagnosis not present

## 2019-08-21 DIAGNOSIS — M1712 Unilateral primary osteoarthritis, left knee: Secondary | ICD-10-CM | POA: Diagnosis not present

## 2019-08-21 MED ORDER — ENOXAPARIN SODIUM 40 MG/0.4ML ~~LOC~~ SOLN: 40.0000 mg | SUBCUTANEOUS | 0 refills | Status: DC

## 2019-08-21 MED ORDER — OXYCODONE HCL 5 MG PO TABS: 5.0000 mg | ORAL_TABLET | ORAL | 0 refills | Status: DC | PRN

## 2019-08-21 MED ORDER — TRAMADOL HCL 50 MG PO TABS: 50.0000 mg | ORAL_TABLET | ORAL | 0 refills | Status: DC | PRN

## 2019-08-21 MED ORDER — CELECOXIB 200 MG PO CAPS: 200.0000 mg | ORAL_CAPSULE | Freq: Two times a day (BID) | ORAL | 0 refills | Status: DC

## 2019-08-21 NOTE — Progress Notes (Signed)
Physical Therapy Treatment Patient Details Name: Mario Wilson MRN: [id number] DOB: [birth year] Today's Date: 08/21/2019    History of Present Illness Degenerative arthrosis of the left knee s/p left total knee arthroplasty 08/20/19. History of arthritis, HTN, coronary artery disease, current smoker, obesity.      PT Comments    Patient is making excellent progress with functional mobility. Pain meds given at start of session by nurse, however patient reports minimal pain overall. Patient is Mod I for transfers. Patient easily ambulated around the nursing unit with rolling walker with supervision with cues for gait pattern. Stair training is completed. Exercises performed with emphasis on left knee extension and flexion for strengthening through maximal ROM. Improvements are also noted in active knee ROM as well. Patient is hopeful to discharge home later today. PT will continue to follow. HHPT recommended at discharge with rolling walker.    Follow Up Recommendations  Home health PT     Equipment Recommendations  Rolling walker with 5" wheels (patient states he does not want 3 in 1 for home use )    Recommendations for Other Services       Precautions / Restrictions Precautions Precautions: Fall;Knee Precaution Booklet Issued: Yes (comment) Restrictions Weight Bearing Restrictions: Yes LLE Weight Bearing: Weight bearing as tolerated    Mobility  Bed Mobility               General bed mobility comments: not assessed as patient sitting up on arrival and post session   Transfers Overall transfer level: Modified independent Equipment used: Rolling walker (2 wheeled) Transfers: Sit to/from Stand Sit to Stand: Modified independent (Device/Increase time)         General transfer comment: patient demonstrating good safety awareness with transfers. multiple standing bouts performed   Ambulation/Gait Ambulation/Gait assistance: Supervision Gait Distance (Feet): 200  Feet Assistive device: Rolling walker (2 wheeled) Gait Pattern/deviations: Step-through pattern;Decreased stance time - left Gait velocity: decreased   General Gait Details: verbal cues for heel to gait pattern and patient able to demonstrate with increased ambulation distance.    Stairs Stairs: Yes Stairs assistance: Supervision Stair Management: Two rails;Alternating pattern Number of Stairs: 4 General stair comments: with verbal instruction on stair negotation, patient is able to demonstrate correct technique without difficulty.    Wheelchair Mobility    Modified Rankin (Stroke Patients Only)       Balance   Sitting-balance support: Feet supported Sitting balance-Leahy Scale: Normal     Standing balance support: No upper extremity supported Standing balance-Leahy Scale: Fair Standing balance comment: patient is able to stand briefly without UE support without loss of balance.                             Cognition Arousal/Alertness: Awake/alert Behavior During Therapy: WFL for tasks assessed/performed Overall Cognitive Status: Within Functional Limits for tasks assessed                                        Exercises Total Joint Exercises Ankle Circles/Pumps: AROM;Strengthening;10 reps;Seated;Left Quad Sets: AROM;Strengthening;Left;10 reps;Seated (in reclined position in chair ) Straight Leg Raises: AROM;Left;10 reps;Seated;Strengthening (in reclined position in chair ) Long Arc Quad: AROM;Left;10 reps;Strengthening;Seated Goniometric ROM: left knee AROM flexion 82 degrees measured in sitting position. left knee extension lacking 2 degrees to fill extension measured in reclined position in chair.  Other Exercises  Other Exercises: all exercises performed with AROM with verbal and visual cues for technique. emphasis on full knee extension and flexion as appropriate     General Comments        Pertinent Vitals/Pain Pain Assessment:  Faces Faces Pain Scale: Hurts a little bit Pain Location: left knee  Pain Descriptors / Indicators: Tightness Pain Intervention(s): Monitored during session;Limited activity within patient's tolerance;RN gave pain meds during session (polar in place post session )    Home Living                      Prior Function            PT Goals (current goals can now be found in the care plan section) Acute Rehab PT Goals Patient Stated Goal: to walk and go home  PT Goal Formulation: With patient/family Time For Goal Achievement: 09/03/19 Potential to Achieve Goals: Good Progress towards PT goals: Progressing toward goals    Frequency    BID      PT Plan Current plan remains appropriate    Co-evaluation              AM-PAC PT "6 Clicks" Mobility   Outcome Measure  Help needed turning from your back to your side while in a flat bed without using bedrails?: None Help needed moving from lying on your back to sitting on the side of a flat bed without using bedrails?: None Help needed moving to and from a bed to a chair (including a wheelchair)?: A Little Help needed standing up from a chair using your arms (e.g., wheelchair or bedside chair)?: A Little Help needed to walk in hospital room?: A Little Help needed climbing 3-5 steps with a railing? : A Little 6 Click Score: 20    End of Session Equipment Utilized During Treatment: Gait belt Activity Tolerance: Patient tolerated treatment well Patient left: in chair;with call bell/phone within reach;with chair alarm set;with SCD's reapplied (polar care in place on left knee) Nurse Communication: Mobility status PT Visit Diagnosis: Unsteadiness on feet (R26.81);Other abnormalities of gait and mobility (R26.89)     Time: 8242-3536 PT Time Calculation (min) (ACUTE ONLY): 30 min  Charges:  $Gait Training: 8-22 mins $Therapeutic Exercise: 8-22 mins                     Mario Wilson, PT, MPT    Mario Wilson 08/21/2019, 9:24 AM

## 2019-08-21 NOTE — Discharge Summary (Signed)
Physician Discharge Summary  Patient ID: Rasmus Preusser MRN: [id number] DOB/AGE: 07-15-1950 69 y.o.  Admit date: 08/20/2019 Discharge date: 08/21/2019  Admission Diagnoses:  Total knee replacement status [Z96.659]  Surgeries:Procedure(s):  Left total knee arthroplasty using computer-assisted navigation  SURGEON:  Marciano Sequin. M.D.  ASSISTANT: Cassell Smiles, PA-C (present and scrubbed throughout the case, critical for assistance with exposure, retraction, instrumentation, and closure)  ANESTHESIA: spinal  ESTIMATED BLOOD LOSS: 50 mL  FLUIDS REPLACED: 1200 mL of crystalloid  TOURNIQUET TIME: 92 minutes  DRAINS: 2 medium Hemovac drains  SOFT TISSUE RELEASES: Anterior cruciate ligament, posterior cruciate ligament, deep medial collateral ligament, patellofemoral ligament  IMPLANTS UTILIZED: DePuy Attune size 7 posterior stabilized femoral component (cemented), size 8 rotating platform tibial component (cemented), 41 mm medialized dome patella (cemented), and a 5 mm stabilized rotating platform polyethylene insert.  Discharge Diagnoses: Patient Active Problem List   Diagnosis Date Noted  . 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 History of coronary artery stent placement 08/16/2019  . Severe obesity (BMI 35.0-39.9) with comorbidity ([place]) 08/16/2019  . Primary osteoarthritis of left knee 09/03/2018  . Primary osteoarthritis of right knee 09/03/2018  . 2-vessel coronary artery disease 02/05/2015  . Erectile dysfunction 02/05/2015  . Essential hypertension 02/05/2015  . Hx of hypogonadism 02/05/2015  . Mixed hyperlipidemia 02/05/2015    Past Medical History:  Diagnosis Date  . Arthritis    osteoarthritis  . Hyperlipidemia   . Hypertension      Transfusion: n/a   Consultants (if any):   Discharged Condition: Improved  Hospital Course: Jekhi Bolin is an 69 y.o. male who was admitted 08/20/2019 with a diagnosis of left knee osteoarthritis and went to the  operating room on 08/20/2019 and underwent left total knee arthroplasty. The patient received perioperative antibiotics for prophylaxis (see below). The patient tolerated the procedure well and was transported to PACU in stable condition. After meeting PACU criteria, the patient was subsequently transferred to the Orthopaedics/Rehabilitation unit.   The patient received DVT prophylaxis in the form of early mobilization, Lovenox, Foot Pumps and TED hose. A sacral pad had been placed and heels were elevated off of the bed with rolled towels in order to protect skin integrity. Foley catheter was discontinued on postoperative day #0. Wound drains were discontinued on postoperative day #1. The surgical incision was healing well without signs of infection.  Physical therapy was initiated postoperatively for transfers, gait training, and strengthening. Occupational therapy was initiated for activities of daily living and evaluation for assisted devices. Rehabilitation goals were reviewed in detail with the patient. The patient made steady progress with physical therapy and physical therapy recommended discharge to Home.   The patient achieved the preliminary goals of this hospitalization and was felt to be medically and orthopaedically appropriate for discharge.  He was given perioperative antibiotics:  Anti-infectives (From admission, onward)   Start     Dose/Rate Route Frequency Ordered Stop   08/20/19 1300  ceFAZolin (ANCEF) IVPB 2g/100 mL premix        2 g 200 mL/hr over 30 Minutes Intravenous Every 6 hours 08/20/19 1154 08/21/19 1003   08/20/19 0616  ceFAZolin (ANCEF) 2-4 GM/100ML-% IVPB       Note to Pharmacy: Myles Lipps   : cabinet override      08/20/19 0616 08/20/19 0742   08/20/19 0615  ceFAZolin (ANCEF) IVPB 2g/100 mL premix        2 g 200 mL/hr over 30 Minutes Intravenous On call to O.R.  08/20/19 8099 08/20/19 0751    .  Recent vital signs:  Vitals:   08/21/19 0503 08/21/19 0842    BP: 120/78 135/90  Pulse: 87 68  Resp: 16   Temp: 98 F (36.7 C) 98.2 F (36.8 C)  SpO2: 98% 100%    Recent laboratory studies:  No results for input(s): WBC, HGB, HCT, PLT, K, CL, CO2, BUN, CREATININE, GLUCOSE, CALCIUM, LABPT, INR in the last 72 hours.  Diagnostic Studies: DG Knee Left Port  Result Date: 08/20/2019 CLINICAL DATA:  Postop left knee arthroplasty. EXAM: PORTABLE LEFT KNEE - 1-2 VIEW COMPARISON:  None. FINDINGS: Left total knee arthroplasty. Percutaneous drains are seen in the knee joint with associated fluid and air. Surgical skin staples are in place as well. IMPRESSION: Left total knee arthroplasty with expected postoperative findings. Electronically Signed   By: Leanna Battles M.D.   On: 08/20/2019 11:41    Discharge Medications:   Allergies as of 08/21/2019   No Known Allergies     Medication List    STOP taking these medications   HYDROcodone-acetaminophen 5-325 MG tablet Commonly known as: Norco   naproxen 250 MG tablet Commonly known as: NAPROSYN     TAKE these medications   amLODipine 10 MG tablet Commonly known as: NORVASC Take 10 mg by mouth daily.   atorvastatin 40 MG tablet Commonly known as: LIPITOR Take 40 mg by mouth daily.   celecoxib 200 MG capsule Commonly known as: CELEBREX Take 1 capsule (200 mg total) by mouth 2 (two) times daily. What changed:   when to take this  reasons to take this   enoxaparin 40 MG/0.4ML injection Commonly known as: LOVENOX Inject 0.4 mLs (40 mg total) into the skin daily for 14 days.   lisinopril-hydrochlorothiazide 20-25 MG tablet Commonly known as: ZESTORETIC Take 2 tablets by mouth daily.   oxyCODONE 5 MG immediate release tablet Commonly known as: Oxy IR/ROXICODONE Take 1 tablet (5 mg total) by mouth every 4 (four) hours as needed for moderate pain (pain score 4-6).   traMADol 50 MG tablet Commonly known as: ULTRAM Take 1 tablet (50 mg total) by mouth every 4 (four) hours as needed for  moderate pain.            Durable Medical Equipment  (From admission, onward)         Start     Ordered   08/20/19 1155  DME Walker rolling  Once       Question:  Patient needs a walker to treat with the following condition  Answer:  Total knee replacement status   08/20/19 1154   08/20/19 1155  DME Bedside commode  Once       Question:  Patient needs a bedside commode to treat with the following condition  Answer:  Total knee replacement status   08/20/19 1154          Disposition: home with home health PT      Follow-up Information    Myrtis Ser On 09/04/2019.   Specialty: Orthopedic Surgery Why: at 9:45am Contact information: [address] [facility] [address] [facility] [place] [address] [phone]        Donato Heinz, MD On 10/02/2019.   Specialty: Orthopedic Surgery Why: at 2:30pm Contact information: [address] [facility] [place] [address] [phone]                Lasandra Beech, PA-C 08/21/2019, 3:43 PM

## 2019-08-21 NOTE — TOC Progression Note (Signed)
Transition of Care [facility]) - Progression Note    Patient Details  Name: Isidor Bromell MRN: [id number] Date of Birth: Jul 28, 1950  Transition of Care [facility]) CM/SW [place], RN Phone Number: 08/21/2019, 10:41 AM  Clinical Narrative:    Met with the patient to discuss DC plan and needs He lives with his niece and will be going back there at DC, she provides transportation, I strongly encouraged the patient to allow to set up for [place] [facility], He refuses, I notified Helene Kelp with [facility], He does need a RW and I called Zack with [employer] to arrange, I requested the price of Lovenox and will notify the patient of the cost once obtained   Expected Discharge Plan: Home/Self Care Barriers to Discharge: Continued Medical Work up  Expected Discharge Plan and Services Expected Discharge Plan: Home/Self Care   Discharge Planning Services: CM Consult   Living arrangements for the past 2 months: Single Family Home                 DME Arranged: Walker rolling DME Agency: [employer] Date DME Agency Contacted: 08/21/19 Time DME Agency Contacted: 47 Representative spoke with at DME Agency: [place]: Refused [place]           Social Determinants of Health ([place]) Interventions    Readmission Risk Interventions No flowsheet data found.

## 2019-08-21 NOTE — Progress Notes (Signed)
ORTHOPAEDICS PROGRESS NOTE  PATIENT NAME: Mario Wilson DOB: January 05, 1951  MRN: [id number]  POD # 1: Left total knee arthroplasty  Subjective: The patient rested well last night.  No nausea or vomiting. Pain has been under good control. The patient tolerated physical therapy well yesterday.  He is up in a chair this morning.  Objective: Vital signs in last 24 hours: Temp:  [97.3 F (36.3 C)-98.3 F (36.8 C)] 98 F (36.7 C) (06/15 0503) Pulse Rate:  [62-96] 87 (06/15 0503) Resp:  [9-20] 16 (06/15 0503) BP: (102-144)/(65-95) 120/78 (06/15 0503) SpO2:  [92 %-99 %] 98 % (06/15 0503)  Intake/Output from previous day: 06/14 0701 - 06/15 0700 In: 2650 [P.O.:240; I.V.:1500; IV Piggyback:910] Out: 2020 [Urine:1750; Drains:220; Blood:50]  No results for input(s): WBC, HGB, HCT, PLT, K, CL, CO2, BUN, CREATININE, GLUCOSE, CALCIUM, LABPT, INR in the last 72 hours.  EXAM General: Well-developed well-nourished male seen in no apparent discomfort. Lungs: clear to auscultation Cardiac: normal rate and regular rhythm Abdomen: Soft, nontender, nondistended.  Active bowel sounds are present. Right lower extremity: Dressing is dry and intact.  Polar Care and Hemovac are in place and functioning.  The patient is able to perform an independent straight leg raise.  Homans test is negative. Neurologic: Awake, alert, and oriented.  Sensory and motor function are intact.  Assessment: Left total knee arthroplasty  Secondary diagnoses: Hypertension  Plan: Notes from physical therapy were reviewed. Today's goals were reviewed with the patient.  Continue physical therapy and Occupational Therapy as per total knee arthroplasty rehab protocol. Possible discharge later this afternoon if patient meets criteria. Plan is to go Home after hospital stay. DVT Prophylaxis - Lovenox, Foot Pumps and TED hose  Karessa Onorato P. Angie Fava M.D.

## 2019-08-21 NOTE — TOC Benefit Eligibility Note (Signed)
Transition of Care [facility]) Benefit Eligibility Note    Patient Details  Name: Mario Wilson MRN: [id number] Date of Birth: 1950/08/23   Medication/Dose: Lovenox 40 mg daily x 14 days or Enoxaparin           Spoke with Person/Company/Phone Number:: Davina Poke, [phone], option 5, [employer] [phone] - couldn't locate pt           Additional Notes: Shows no active plan in the system for Ins. ID [id number]    Verita Schneiders [place] Phone Number: 08/21/2019, 2:11 PM

## 2019-08-21 NOTE — Evaluation (Signed)
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 History of arthritis, HTN, coronary artery disease, current smoker, obesity.     Clinical Impression   Mr "Mario Wilson" [facility] seen for OT evaluation this date, POD#1 from above surgery. Pt was independent in all ADL, IADL and functional mobility prior to surgery. He lives with his niece and her husband, is very active, enjoys yardwork and hopes this surgery will improve his golf game. Pt is eager to return to PLOF with less pain and improved safety and independence. Pt currently requires Mod I for UB/LB ADL in either sitting or standing using RW and adaptive strategies. He independtly donned underwear prior to session and reported ambulating to the bathroom to brush teeth with Mod I, declining further ADL/mobility. Pt educated re: compression stocking mgt, polar care mgt, falls prevention and safety, adaptive ADL strategies and safe DME use/home modifications. He is actively receptive; handout provided for carryover. Pt overall demonstrates excellent functional strength, minimal pain, good knowledge of adaptive strategies and safety awareness and has good home support. No further OT needs anticipated at this time.      Follow Up Recommendations  No OT follow up    Equipment Recommendations  None recommended by OT    Recommendations for Other Services       Precautions / Restrictions Precautions Precautions: Fall;Knee Precaution Booklet Issued: Yes (comment) Restrictions Weight Bearing Restrictions: Yes LLE Weight Bearing: Weight bearing as tolerated      Mobility Bed Mobility               General bed mobility comments: not assessed as patient sitting up on arrival and post session   Transfers Overall transfer level: Modified independent Equipment  used: Rolling walker (2 wheeled) Transfers: Sit to/from Stand Sit to Stand: Modified independent (Device/Increase time)         General transfer comment: not assessed this session, see PT notes    Balance   Sitting-balance support: Feet supported;No upper extremity supported Sitting balance-Leahy Scale: Normal     Standing balance support: No upper extremity supported Standing balance-Leahy Scale: Fair Standing balance comment: patient is able to stand briefly without UE support without loss of balance.                            ADL either performed or assessed with clinical judgement   ADL Overall ADL's : Modified independent                                       General ADL Comments: Pt is functionally Mod I for UB/LB ADL with recommended 2WW use for additional balance support. Reports having donned underwear sit <> stand and ambulation to bathroom to brush teeth without assist. Observed steady ambulation around nurse station with PT using RW.     Vision Baseline Vision/History: Wears glasses Wears Glasses: Reading only       Perception     Praxis      Pertinent Vitals/Pain Pain Assessment: No/denies pain Faces Pain Scale: Hurts a little bit Pain Location: left knee  Pain Descriptors / Indicators: Tightness Pain Intervention(s): Monitored during session     Hand Dominance     Extremity/Trunk Assessment Upper Extremity Assessment  Upper Extremity Assessment: Overall WFL for tasks assessed   Lower Extremity Assessment Lower Extremity Assessment: Defer to PT evaluation       Communication Communication Communication: No difficulties   Cognition Arousal/Alertness: Awake/alert Behavior During Therapy: WFL for tasks assessed/performed Overall Cognitive Status: Within Functional Limits for tasks assessed                                     General Comments       Exercises Other Exercises: Pt educated re:  compression stocking mgt, polar care mgt, falls prevention and safety, adaptive ADL strategies and safe DME use/home modifications. Pt actively receptive; handout provided for carryover.   Shoulder Instructions      Home Living Family/patient expects to be discharged to:: Private residence Living Arrangements: Other relatives (niece and her husband) Available Help at Discharge: Family;Available PRN/intermittently Type of Home: House Home Access:  (2 small steps without rails )     Home Layout: Able to live on main level with bedroom/bathroom     Bathroom Shower/Tub: Tub/shower unit;Walk-in shower         Home Equipment: Shower seat - built in          Prior Functioning/Environment Level of Independence: Independent        Comments: patient is very active at baseline, enjoys golf and yard work; Indep in ADL, IADL and functional mobility without AD; 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, historic buildings; still drives; prior L THA and L foot surgery; no falls reported        OT Problem List: Decreased strength;Decreased range of motion;Decreased knowledge of use of DME or AE;Impaired balance (sitting and/or standing)      OT Treatment/Interventions:      OT Goals(Current goals can be found in the care plan section) Acute Rehab OT Goals Patient Stated Goal: To be a better golfer after surgery OT Goal Formulation: All assessment and education complete, DC therapy  OT Frequency:     Barriers to D/C:            Co-evaluation              AM-PAC OT "6 Clicks" Daily Activity     Outcome Measure Help from another person eating meals?: None Help from another person taking care of personal grooming?: None Help from another person toileting, which includes using toliet, bedpan, or urinal?: None Help from another person bathing (including washing, rinsing, drying)?: A Little Help from another person to put on and taking off regular upper body clothing?: None Help from another  person to put on and taking off regular lower body clothing?: None 6 Click Score: 23   End of Session Nurse Communication: Mobility status  Activity Tolerance: Patient tolerated treatment well Patient left: in chair;with call bell/phone within reach;with chair alarm set  OT Visit Diagnosis: Other abnormalities of gait and mobility (R26.89)                Time: 1601-0932 OT Time Calculation (min): 21 min Charges:  OT General Charges $OT Visit: 1 Visit OT Evaluation $OT Eval Low Complexity: 1 Low OT Treatments $Self Care/Home Management : 8-22 mins  Jerilynn Birkenhead, OTS 08/21/19, 10:24 AM

## 2019-08-21 NOTE — Progress Notes (Signed)
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 History of arthritis, HTN, coronary artery disease, current smoker, obesity.      PT Comments    Patient continues to make excellent progress with mobility. Patient walked a lap around nursing unit, Mod I using rolling walker with occasional cues for gait kinematics. Patient demonstrates good safety awareness with transfers, ambulation, and functional activity. Stair training completed in earlier session today. Educated patient on car safety and technique with car transfers and patient verbalized understanding. Patient is eager to be discharged today and has excellent family support at home.    Follow Up Recommendations  Home health PT     Equipment Recommendations  Rolling walker with 5" wheels    Recommendations for Other Services       Precautions / Restrictions Precautions Precautions: Fall;Knee Precaution Booklet Issued: Yes (comment) Restrictions Weight Bearing Restrictions: Yes LLE Weight Bearing: Weight bearing as tolerated    Mobility  Bed Mobility               General bed mobility comments: not assessed as patient sitting up on arrival and post session   Transfers Overall transfer level: Modified independent Equipment used: Rolling walker (2 wheeled) Transfers: Sit to/from Stand Sit to Stand: Modified independent (Device/Increase time)         General transfer comment: good carryover of safety instructions noted   Ambulation/Gait Ambulation/Gait assistance: Modified independent (Device/Increase time) Gait Distance (Feet): 250 Feet Assistive device: Rolling walker (2 wheeled) Gait Pattern/deviations: Step-through pattern Gait velocity: decreased   General Gait Details: verbal cues for heel toe gait pattern and safety     Stairs             Wheelchair Mobility    Modified Rankin (Stroke Patients Only)       Balance   Sitting-balance support: Feet supported Sitting balance-Leahy Scale: Normal     Standing balance support: Bilateral upper extremity supported Standing balance-Leahy Scale: Good                              Cognition Arousal/Alertness: Awake/alert Behavior During Therapy: WFL for tasks assessed/performed Overall Cognitive Status: Within Functional Limits for tasks assessed                                        Exercises Other Exercises Other Exercises: Patient reports he has performed HEP without difficulty and declines further exercise at this time. patient has HEP handout to take home and this has been reviewed with the patient.  Other Exercises: Pt educated re: compression stocking mgt, polar care mgt, falls prevention and safety, adaptive ADL strategies and safe DME use/home modifications. Pt actively receptive; handout provided for carryover.    General Comments        Pertinent Vitals/Pain Pain Assessment: No/denies pain Pain Intervention(s): Monitored during session    Home Living Family/patient expects to be discharged to:: Private residence Living Arrangements: Other relatives (niece and her husband) Available Help at Discharge: Family;Available PRN/intermittently Type of Home: House Home Access:  (2 small steps without rails )   Home Layout: Able to live on main level with bedroom/bathroom Home Equipment: Shower seat - built in  Prior Function Level of Independence: Independent      Comments: patient is very active at baseline, enjoys golf and yard work; Indep in ADL, IADL and functional mobility without AD; 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, historic buildings; still drives; prior L THA and L foot surgery; no falls reported   PT Goals (current goals can now be found in the care plan section) Acute Rehab PT Goals Patient  Stated Goal: wants to go home today as soon as possible  PT Goal Formulation: With patient Time For Goal Achievement: 09/03/19 Potential to Achieve Goals: Good Progress towards PT goals: Progressing toward goals    Frequency    BID      PT Plan Current plan remains appropriate    Co-evaluation              AM-PAC PT "6 Clicks" Mobility   Outcome Measure  Help needed turning from your back to your side while in a flat bed without using bedrails?: None Help needed moving from lying on your back to sitting on the side of a flat bed without using bedrails?: None Help needed moving to and from a bed to a chair (including a wheelchair)?: None Help needed standing up from a chair using your arms (e.g., wheelchair or bedside chair)?: None Help needed to walk in hospital room?: None Help needed climbing 3-5 steps with a railing? : A Little 6 Click Score: 23    End of Session Equipment Utilized During Treatment: Gait belt Activity Tolerance: Patient tolerated treatment well Patient left: in chair;with call bell/phone within reach;with SCD's reapplied Nurse Communication: Mobility status PT Visit Diagnosis: Unsteadiness on feet (R26.81);Other abnormalities of gait and mobility (R26.89)     Time: 1315-1330 PT Time Calculation (min) (ACUTE ONLY): 15 min  Charges:  $Gait Training: 8-22 mins                     Minna Merritts, PT, MPT    Mario Wilson 08/21/2019, 1:39 PM

## 2019-08-21 NOTE — TOC Progression Note (Signed)
Transition of Care [facility]) - Progression Note    Patient Details  Name: Kimarion Chery MRN: [id number] Date of Birth: May 16, 1950  [facility]) CM/SW Contact  Barrie Dunker, RN Phone Number: 08/21/2019, 4:22 PM  Clinical Narrative:    I was notified by the revenue integrity team to expect a Code 44 today if he was going to DC today at 9:59 AM We received a DC order this afternoon at 1544, at 1544 the Revenue team put in a potential code 44, I Notified the UR team that he had a DC order in place and asked if they were going to make the patient a Code 44, I was told they were in the process of trying to reach the physician to write the order, I called my supervisor as well as the Asst Director for my department, I also Called the Medical Director Dr Judithann Sheen, He said to leave the patient inpatient if there was a prior authorization for inpatient: however he was not on Duty at this time. The patient's ride was waiting to take the patient home.  I contacted my supervisor again making her aware of the issue,  The UR nurse stated that they were not able to reach the physician, The patient's ride was still waiting in the room for the patient to DC home, They did not want to wait any longer, The order was placed to make observation at 1617, The patient had already left the building and the Code 44 was not given, I notified the UR team that the Code 44 was not given, The UR supervisor called me to verify that the Code 44 was not completed   Expected Discharge Plan: Home/Self Care Barriers to Discharge: Continued Medical Work up  Expected Discharge Plan and Services Expected Discharge Plan: Home/Self Care   Discharge Planning Services: CM Consult   Living arrangements for the past 2 months: Single Family Home Expected Discharge Date: 08/21/19               DME Arranged: Dan Humphreys rolling DME Agency: [employer] Date DME Agency Contacted: 08/21/19 Time DME Agency Contacted:  1040 Representative spoke with at DME Agency: [facility] Arranged: Refused HH           Social Determinants of Health (SDOH) Interventions    Readmission Risk Interventions No flowsheet data found.

## 2019-08-21 NOTE — Progress Notes (Addendum)
DC papers was discussed to pt.Education on  Lovenox injection was done, questions answered.  Hemovac was removed by PA, Pt had BM. Pt left the floor via wheelchair assisted by RN and NT.

## 2019-08-22 NOTE — Anesthesia Postprocedure Evaluation (Signed)
Anesthesia Post Note  Patient: Mario Wilson  Procedure(s) Performed: COMPUTER ASSISTED TOTAL KNEE ARTHROPLASTY (Left Knee)  Patient location during evaluation: PACU Anesthesia Type: Spinal Level of consciousness: oriented and awake and alert Pain management: pain level controlled Vital Signs Assessment: post-procedure vital signs reviewed and stable Respiratory status: spontaneous breathing, respiratory function stable and nonlabored ventilation Cardiovascular status: blood pressure returned to baseline and stable Postop Assessment: no headache and no backache Anesthetic complications: no   No complications documented.   Last Vitals:  Vitals:   08/21/19 0503 08/21/19 0842  BP: 120/78 135/90  Pulse: 87 68  Resp: 16   Temp: 36.7 C 36.8 C  SpO2: 98% 100%    Last Pain:  Vitals:   08/21/19 0945  TempSrc:   PainSc: 0-No pain                 Jelina Paulsen

## 2019-09-15 ENCOUNTER — Encounter: Payer: Self-pay | Admitting: Orthopedic Surgery

## 2020-07-03 ENCOUNTER — Other Ambulatory Visit (HOSPITAL_COMMUNITY): Payer: Self-pay | Admitting: Podiatry

## 2020-07-03 ENCOUNTER — Other Ambulatory Visit: Payer: Self-pay | Admitting: Podiatry

## 2020-07-03 DIAGNOSIS — G5762 Lesion of plantar nerve, left lower limb: Secondary | ICD-10-CM

## 2020-07-16 ENCOUNTER — Ambulatory Visit
Admission: RE | Admit: 2020-07-16 | Discharge: 2020-07-16 | Disposition: A | Discharge status: Home / Self Care | Payer: Medicare Other | Source: Ambulatory Visit | Attending: Podiatry | Admitting: Podiatry

## 2020-07-16 ENCOUNTER — Other Ambulatory Visit: Payer: Self-pay

## 2020-07-16 DIAGNOSIS — G5762 Lesion of plantar nerve, left lower limb: Secondary | ICD-10-CM | POA: Diagnosis present

## 2020-07-21 ENCOUNTER — Other Ambulatory Visit: Payer: Self-pay | Admitting: Podiatry

## 2020-07-23 ENCOUNTER — Encounter: Payer: Self-pay | Admitting: Podiatry

## 2020-07-24 ENCOUNTER — Encounter: Payer: Self-pay | Admitting: Podiatry

## 2020-08-01 NOTE — Discharge Instructions (Signed)
[facility]  POST OPERATIVE INSTRUCTIONS FOR DR. FOWLER AND [facility] PODIATRY DEPARTMENT   1. Take your medication as prescribed.  Pain medication should be taken only as needed.  2. Keep the dressing clean, dry and intact.  3. Keep your foot elevated above the heart level for the first 48 hours.  4. Walking to the bathroom and brief periods of walking are acceptable, unless we have instructed you to be non-weight bearing.  5. Always wear your post-op shoe when walking.  Always use your crutches if you are to be non-weight bearing.  6. Do not take a shower. Baths are permissible as long as the foot is kept out of the water.   7. Every hour you are awake:  - Bend your knee 15 times. - Flex foot 15 times - Massage calf 15 times  8. Call [facility] ([phone]) if any of the following problems occur: - You develop a temperature or fever. - The bandage becomes saturated with blood. - Medication does not stop your pain. - Injury of the foot occurs. - Any symptoms of infection including redness, odor, or red streaks running from wound.   General Anesthesia, Adult, Care After This sheet gives you information about how to care for yourself after your procedure. Your health care provider may also give you more specific instructions. If you have problems or questions, contact your health care provider. What can I expect after the procedure? After the procedure, the following side effects are common:  Pain or discomfort at the IV site.  Nausea.  Vomiting.  Sore throat.  Trouble concentrating.  Feeling cold or chills.  Feeling weak or tired.  Sleepiness and fatigue.  Soreness and body aches. These side effects can affect parts of the body that were not involved in surgery. Follow these instructions at home: For the time period you were told by your health care provider:  Rest.  Do not participate in  activities where you could fall or become injured.  Do not drive or use machinery.  Do not drink alcohol.  Do not take sleeping pills or medicines that cause drowsiness.  Do not make important decisions or sign legal documents.  Do not take care of children on your own.   Eating and drinking  Follow any instructions from your health care provider about eating or drinking restrictions.  When you feel hungry, start by eating small amounts of foods that are soft and easy to digest (bland), such as toast. Gradually return to your regular diet.  Drink enough fluid to keep your urine pale yellow.  If you vomit, rehydrate by drinking water, juice, or clear broth. General instructions  If you have sleep apnea, surgery and certain medicines can increase your risk for breathing problems. Follow instructions from your health care provider about wearing your sleep device: ? Anytime you are sleeping, including during daytime naps. ? While taking prescription pain medicines, sleeping medicines, or medicines that make you drowsy.  Have a responsible adult stay with you for the time you are told. It is important to have someone help care for you until you are awake and alert.  Return to your normal activities as told by your health care provider. Ask your health care provider what activities are safe for you.  Take over-the-counter and prescription medicines only as told by your health care provider.  If you smoke, do not smoke without supervision.  Keep all follow-up visits as told   by your health care provider. This is important. Contact a health care provider if:  You have nausea or vomiting that does not get better with medicine.  You cannot eat or drink without vomiting.  You have pain that does not get better with medicine.  You are unable to pass urine.  You develop a skin rash.  You have a fever.  You have redness around your IV site that gets worse. Get help right away  if:  You have difficulty breathing.  You have chest pain.  You have blood in your urine or stool, or you vomit blood. Summary  After the procedure, it is common to have a sore throat or nausea. It is also common to feel tired.  Have a responsible adult stay with you for the time you are told. It is important to have someone help care for you until you are awake and alert.  When you feel hungry, start by eating small amounts of foods that are soft and easy to digest (bland), such as toast. Gradually return to your regular diet.  Drink enough fluid to keep your urine pale yellow.  Return to your normal activities as told by your health care provider. Ask your health care provider what activities are safe for you. This information is not intended to replace advice given to you by your health care provider. Make sure you discuss any questions you have with your health care provider. Document Revised: 11/08/2019 Document Reviewed: 06/07/2019 Elsevier Patient Education  2021 [employer]  

## 2020-08-06 ENCOUNTER — Encounter: Payer: Self-pay | Admitting: Podiatry

## 2020-08-06 ENCOUNTER — Other Ambulatory Visit: Payer: Self-pay

## 2020-08-06 ENCOUNTER — Encounter
Admission: RE | Disposition: A | Discharge status: Home / Self Care | Payer: Self-pay | Source: Home / Self Care | Attending: Podiatry

## 2020-08-06 ENCOUNTER — Ambulatory Visit: Payer: Medicare Other | Admitting: Anesthesiology

## 2020-08-06 ENCOUNTER — Ambulatory Visit
Admission: RE | Admit: 2020-08-06 | Discharge: 2020-08-06 | Disposition: A | Discharge status: Home / Self Care | Payer: Medicare Other | Attending: Podiatry | Admitting: Podiatry

## 2020-08-06 DIAGNOSIS — Z7982 Long term (current) use of aspirin: Secondary | ICD-10-CM | POA: Diagnosis not present

## 2020-08-06 DIAGNOSIS — Z6834 Body mass index (BMI) 34.0-34.9, adult: Secondary | ICD-10-CM | POA: Insufficient documentation

## 2020-08-06 DIAGNOSIS — G5762 Lesion of plantar nerve, left lower limb: Secondary | ICD-10-CM | POA: Insufficient documentation

## 2020-08-06 DIAGNOSIS — Z79899 Other long term (current) drug therapy: Secondary | ICD-10-CM | POA: Insufficient documentation

## 2020-08-06 DIAGNOSIS — Z96652 Presence of left artificial knee joint: Secondary | ICD-10-CM | POA: Insufficient documentation

## 2020-08-06 DIAGNOSIS — F17290 Nicotine dependence, other tobacco product, uncomplicated: Secondary | ICD-10-CM | POA: Insufficient documentation

## 2020-08-06 DIAGNOSIS — F17210 Nicotine dependence, cigarettes, uncomplicated: Secondary | ICD-10-CM | POA: Insufficient documentation

## 2020-08-06 DIAGNOSIS — Z955 Presence of coronary angioplasty implant and graft: Secondary | ICD-10-CM | POA: Diagnosis not present

## 2020-08-06 HISTORY — PX: EXCISION MORTON'S NEUROMA: SHX5013

## 2020-08-06 SURGERY — EXCISION, MORTON'S NEUROMA
Anesthesia: General | Site: Foot | Laterality: Left

## 2020-08-06 MED ORDER — GLYCOPYRROLATE 0.2 MG/ML IJ SOLN
INTRAMUSCULAR | Status: DC | PRN
  Administered 2020-08-06: .1 mg via INTRAVENOUS

## 2020-08-06 MED ORDER — ONDANSETRON HCL 4 MG/2ML IJ SOLN
INTRAMUSCULAR | Status: DC | PRN
  Administered 2020-08-06: 4 mg via INTRAVENOUS

## 2020-08-06 MED ORDER — BUPIVACAINE LIPOSOME 1.3 % IJ SUSP
INTRAMUSCULAR | Status: DC | PRN
  Administered 2020-08-06 (×2): 10 mL

## 2020-08-06 MED ORDER — POVIDONE-IODINE 7.5 % EX SOLN: Freq: Once | CUTANEOUS | Status: DC

## 2020-08-06 MED ORDER — LIDOCAINE HCL (CARDIAC) PF 100 MG/5ML IV SOSY
PREFILLED_SYRINGE | INTRAVENOUS | Status: DC | PRN
  Administered 2020-08-06: 50 mg via INTRATRACHEAL

## 2020-08-06 MED ORDER — DEXAMETHASONE SODIUM PHOSPHATE 4 MG/ML IJ SOLN
INTRAMUSCULAR | Status: DC | PRN
  Administered 2020-08-06: 4 mg via INTRAVENOUS

## 2020-08-06 MED ORDER — OXYCODONE-ACETAMINOPHEN 5-325 MG PO TABS: 1.0000 | ORAL_TABLET | Freq: Four times a day (QID) | ORAL | 0 refills | Status: DC | PRN

## 2020-08-06 MED ORDER — EPHEDRINE SULFATE 50 MG/ML IJ SOLN
INTRAMUSCULAR | Status: DC | PRN
  Administered 2020-08-06 (×2): 10 mg via INTRAVENOUS
  Administered 2020-08-06 (×3): 5 mg via INTRAVENOUS

## 2020-08-06 MED ORDER — PROPOFOL 10 MG/ML IV BOLUS
INTRAVENOUS | Status: DC | PRN
  Administered 2020-08-06: 130 mg via INTRAVENOUS

## 2020-08-06 MED ORDER — MIDAZOLAM HCL 5 MG/5ML IJ SOLN
INTRAMUSCULAR | Status: DC | PRN
  Administered 2020-08-06: 2 mg via INTRAVENOUS

## 2020-08-06 MED ORDER — FENTANYL CITRATE (PF) 100 MCG/2ML IJ SOLN
INTRAMUSCULAR | Status: DC | PRN
  Administered 2020-08-06: 100 ug via INTRAVENOUS

## 2020-08-06 MED ORDER — LACTATED RINGERS IV SOLN: INTRAVENOUS | Status: DC

## 2020-08-06 MED ORDER — CEFAZOLIN SODIUM-DEXTROSE 2-4 GM/100ML-% IV SOLN
2.0000 g | INTRAVENOUS | Status: AC
  Administered 2020-08-06: 2 g via INTRAVENOUS

## 2020-08-06 MED ORDER — BUPIVACAINE HCL (PF) 0.25 % IJ SOLN
INTRAMUSCULAR | Status: DC | PRN
  Administered 2020-08-06: 10 mL

## 2020-08-06 SURGICAL SUPPLY — 36 items
APL SKNCLS STERI-STRIP NONHPOA (GAUZE/BANDAGES/DRESSINGS) ×1
BENZOIN TINCTURE PRP APPL 2/3 (GAUZE/BANDAGES/DRESSINGS) ×2 IMPLANT
BNDG CMPR 75X41 PLY HI ABS (GAUZE/BANDAGES/DRESSINGS) ×1
BNDG CMPR STD VLCR NS LF 5.8X4 (GAUZE/BANDAGES/DRESSINGS) ×1
BNDG COHESIVE 4X5 TAN STRL (GAUZE/BANDAGES/DRESSINGS) ×2 IMPLANT
BNDG ELASTIC 4X5.8 VLCR NS LF (GAUZE/BANDAGES/DRESSINGS) ×2 IMPLANT
BNDG ESMARK 4X12 TAN STRL LF (GAUZE/BANDAGES/DRESSINGS) ×2 IMPLANT
BNDG STRETCH 4X75 STRL LF (GAUZE/BANDAGES/DRESSINGS) ×2 IMPLANT
CANISTER SUCT 1200ML W/VALVE (MISCELLANEOUS) ×2 IMPLANT
COVER LIGHT HANDLE UNIVERSAL (MISCELLANEOUS) ×2 IMPLANT
DRSG GAUZE FLUFF 36X18 (GAUZE/BANDAGES/DRESSINGS) ×2 IMPLANT
DURAPREP 26ML APPLICATOR (WOUND CARE) ×2 IMPLANT
ELECT REM PT RETURN 9FT ADLT (ELECTROSURGICAL) ×2
ELECTRODE REM PT RTRN 9FT ADLT (ELECTROSURGICAL) ×1 IMPLANT
GAUZE SPONGE 4X4 12PLY STRL (GAUZE/BANDAGES/DRESSINGS) ×2 IMPLANT
GAUZE XEROFORM 1X8 LF (GAUZE/BANDAGES/DRESSINGS) ×2 IMPLANT
GLOVE SURG ENC MOIS LTX SZ7.5 (GLOVE) ×2 IMPLANT
GLOVE SURG SYN 8.5  E (GLOVE) ×1
GLOVE SURG SYN 8.5 E (GLOVE) ×1 IMPLANT
GOWN STRL REUS W/ TWL LRG LVL3 (GOWN DISPOSABLE) ×2 IMPLANT
GOWN STRL REUS W/TWL LRG LVL3 (GOWN DISPOSABLE) ×4
HEMOSTAT SURGICEL 2X3 (HEMOSTASIS) ×2 IMPLANT
NS IRRIG 500ML POUR BTL (IV SOLUTION) ×2 IMPLANT
PACK EXTREMITY ARMC (MISCELLANEOUS) ×2 IMPLANT
PENCIL SMOKE EVACUATOR (MISCELLANEOUS) ×2 IMPLANT
STOCKINETTE IMPERVIOUS LG (DRAPES) ×2 IMPLANT
STRAP BODY AND KNEE 60X3 (MISCELLANEOUS) ×2 IMPLANT
STRIP CLOSURE SKIN 1/4X4 (GAUZE/BANDAGES/DRESSINGS) ×2 IMPLANT
SUT ETHILON 3-0 (SUTURE) ×2 IMPLANT
SUT VIC AB 3-0 SH 27 (SUTURE) ×2
SUT VIC AB 3-0 SH 27X BRD (SUTURE) ×1 IMPLANT
SUT VIC AB 4-0 RB1 27 (SUTURE) ×2
SUT VIC AB 4-0 RB1 27X BRD (SUTURE) ×1 IMPLANT
SUT VIC AB 4-0 SH 27 (SUTURE) ×2
SUT VIC AB 4-0 SH 27XANBCTRL (SUTURE) ×1 IMPLANT
SYR 10ML LL (SYRINGE) ×2 IMPLANT

## 2020-08-06 NOTE — H&P (Signed)
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  The various methods of treatment have been discussed with the patient.  No guarantees were given.  After consideration of risks, benefits and other options for treatment, the patient has consented to surgery.  I have reviewed the patients' chart and labs.     A history and physical examination was performed in my office.  The patient was reexamined.  There have been no changes to this history and physical examination.  [name]

## 2020-08-06 NOTE — Anesthesia Preprocedure Evaluation (Signed)
Anesthesia Evaluation  Patient identified by MRN, date of birth, ID band Patient awake    Reviewed: Allergy & Precautions, NPO status , Patient's Chart, lab work & pertinent test results, reviewed documented beta blocker date and time   History of Anesthesia Complications Negative for: history of anesthetic complications  Airway Mallampati: II  TM Distance: >3 FB Neck ROM: Full    Dental no notable dental hx.    Pulmonary Current Smoker and Patient abstained from smoking.,    Pulmonary exam normal breath sounds clear to auscultation       Cardiovascular Exercise Tolerance: Good hypertension, (-) angina+ CAD  (-) DOE Normal cardiovascular exam Rhythm:Regular Rate:Normal   HLD   Neuro/Psych    GI/Hepatic neg GERD  ,  Endo/Other    Renal/GU      Musculoskeletal  (+) Arthritis ,   Abdominal (+) + obese (BMI 36),  Abdomen: soft.    Peds  Hematology   Anesthesia Other Findings   Reproductive/Obstetrics                             Anesthesia Physical  Anesthesia Plan  ASA: III  Anesthesia Plan: General   Post-op Pain Management:    Induction: Intravenous  PONV Risk Score and Plan: 1 and Propofol infusion, TIVA and Treatment may vary due to age or medical condition  Airway Management Planned: Natural Airway and Nasal Cannula  Additional Equipment:   Intra-op Plan:   Post-operative Plan:   Informed Consent: I have reviewed the patients History and Physical, chart, labs and discussed the procedure including the risks, benefits and alternatives for the proposed anesthesia with the patient or authorized representative who has indicated his/her understanding and acceptance.       Plan Discussed with: CRNA  Anesthesia Plan Comments:         Anesthesia Quick Evaluation  Patient Active Problem List   Diagnosis Date Noted  . 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  History of coronary artery stent placement 08/16/2019  . Severe obesity (BMI 35.0-39.9) with comorbidity (HCC) 08/16/2019  . Primary osteoarthritis of left knee 09/03/2018  . Primary osteoarthritis of right knee 09/03/2018  . 2-vessel coronary artery disease 02/05/2015  . Erectile dysfunction 02/05/2015  . Essential hypertension 02/05/2015  . Hx of hypogonadism 02/05/2015  . Mixed hyperlipidemia 02/05/2015    CBC Latest Ref Rng & Units 08/08/2019  WBC 4.0 - 10.5 K/uL 5.5  Hemoglobin 13.0 - 17.0 g/dL 01.7  Hematocrit 49.4 - 52.0 % 39.9  Platelets 150 - 400 K/uL 149(L)   BMP Latest Ref Rng & Units 08/08/2019  Glucose 70 - 99 mg/dL 496(P)  BUN 8 - 23 mg/dL 59(F)  Creatinine 6.38 - 1.24 mg/dL 4.66  Sodium 599 - 357 mmol/L 137  Potassium 3.5 - 5.1 mmol/L 3.5  Chloride 98 - 111 mmol/L 101  CO2 22 - 32 mmol/L 25  Calcium 8.9 - 10.3 mg/dL 8.9    Risks and benefits of anesthesia discussed at length, patient or surrogate demonstrates understanding. Appropriately NPO. Plan to proceed with anesthesia.  Corlis Leak, MD 08/06/20

## 2020-08-06 NOTE — Anesthesia Postprocedure Evaluation (Signed)
Anesthesia Post Note  Patient: Mario Wilson  Procedure(s) Performed: EXCISION MORTON'S NEUROMA- LEFT FT X2 (Left Foot)     Patient location during evaluation: PACU Anesthesia Type: General Level of consciousness: awake and alert Pain management: pain level controlled Vital Signs Assessment: post-procedure vital signs reviewed and stable Respiratory status: spontaneous breathing, nonlabored ventilation, respiratory function stable and patient connected to nasal cannula oxygen Cardiovascular status: blood pressure returned to baseline and stable Postop Assessment: no apparent nausea or vomiting Anesthetic complications: no   No complications documented.  Edwyna Ready

## 2020-08-06 NOTE — Anesthesia Procedure Notes (Signed)
Procedure Name: LMA Insertion Date/Time: 08/06/2020 12:26 PM Performed by: Jimmy Picket, CRNA Pre-anesthesia Checklist: Patient identified, Emergency Drugs available, Suction available, Timeout performed and Patient being monitored Patient Re-evaluated:Patient Re-evaluated prior to induction Oxygen Delivery Method: Circle system utilized Preoxygenation: Pre-oxygenation with 100% oxygen Induction Type: IV induction LMA: LMA inserted LMA Size: 4.0 Number of attempts: 1 Placement Confirmation: positive ETCO2 and breath sounds checked- equal and bilateral Tube secured with: Tape

## 2020-08-06 NOTE — Op Note (Signed)
Operative note   Surgeon:Cedrick Partain Armed forces logistics/support/administrative officer: None    Preop diagnosis: 1.  Morton's neuroma left third webspace 2.  Recurrent neuroma left second webspace    Postop diagnosis: Same    Procedure: 1.  Excision Morton's neuroma left third webspace 2.  Excision recurrent neuroma left second webspace    EBL: Minimal    Anesthesia:local and general    Hemostasis: Ankle tourniquet inflated to 250 mmHg for approximately 50 minutes    Specimen: Recurrent second webspace neuroma and third webspace neuroma left foot    Complications: None    Operative indications:Mario Wilson is an 70 y.o. that presents today for surgical intervention.  The risks/benefits/alternatives/complications have been discussed and consent has been given.    Procedure:  Patient was brought into the OR and placed on the operating table in thesupine position. After anesthesia was obtained theleft lower extremity was prepped and draped in usual sterile fashion.  Attention was directed to the left foot where a dorsal incision was made initially between the third and fourth metatarsal heads.  Sharp and blunt dissection carried down to the DTI L.  This was transected.  Further dissection revealed the large bulbous neuroma in this area.  The 2 distal arms to the third and fourth toes were then noted and transected.  This was then dissected back to the metatarsal head region.  It was then resected and allowed to retract proximal.  This was a very large bulbous Morton's neuroma.  This was sent for pathological examination.  Attention was then directed plantarly to the second intermetatarsal space and webspace where a curvilinear incision was performed.  Sharp and blunt dissection carried down through the fat pad.  At this time there was noted to be recurrent neuroma with thickening of the nerve and scarring diffusely.  This was then dissected back proximal to the metatarsophalangeal joint to about the level of the midshaft  region.  This was then transected.  This neuroma material was then sent for pathological examination.  All areas were inspected and flushed appropriately.  Bovie cauterization was performed for all bleeders.  Lesion was performed with a 3-0 Vicryl for the plantar subcutaneous tissue and a 3-0 nylon for the skin and a 4-0 Vicryl for the dorsal subcutaneous tissue and a 4-0 nylon for skin dorsally.  A bulky sterile dressing was applied to the left foot.  Tourniquet was dropped and good perfusion was noted to all digits.    Patient tolerated the procedure and anesthesia well.  Was transported from the OR to the PACU with all vital signs stable and vascular status intact. To be discharged per routine protocol.  Will follow up in approximately 1 week in the outpatient clinic.

## 2020-08-06 NOTE — Transfer of Care (Signed)
Immediate Anesthesia Transfer of Care Note  Patient: Mario Wilson  Procedure(s) Performed: EXCISION MORTON'S NEUROMA- LEFT FT X2 (Left Foot)  Patient Location: PACU  Anesthesia Type: General  Level of Consciousness: awake, alert  and patient cooperative  Airway and Oxygen Therapy: Patient Spontanous Breathing and Patient connected to supplemental oxygen  Post-op Assessment: Post-op Vital signs reviewed, Patient's Cardiovascular Status Stable, Respiratory Function Stable, Patent Airway and No signs of Nausea or vomiting  Post-op Vital Signs: Reviewed and stable  Complications: No complications documented.

## 2020-08-07 ENCOUNTER — Encounter: Payer: Self-pay | Admitting: Podiatry

## 2020-08-08 LAB — SURGICAL PATHOLOGY

## 2020-10-16 ENCOUNTER — Other Ambulatory Visit: Payer: Self-pay | Admitting: Orthopedic Surgery

## 2020-10-26 NOTE — Discharge Instructions (Signed)
Instructions after Total Knee Replacement   Akua Blethen P. Rhythm Wigfall, Jr., M.D.     Dept. of Orthopaedics & Sports Medicine  [facility]  [address]  [place], [place]  [address]  Phone: [phone]   Fax: [phone]    DIET: Drink plenty of non-alcoholic fluids. Resume your normal diet. Include foods high in fiber.  ACTIVITY:  You may use crutches or a walker with weight-bearing as tolerated, unless instructed otherwise. You may be weaned off of the walker or crutches by your Physical Therapist.  Do NOT place pillows under the knee. Anything placed under the knee could limit your ability to straighten the knee.   Continue doing gentle exercises. Exercising will reduce the pain and swelling, increase motion, and prevent muscle weakness.   Please continue to use the TED compression stockings for 6 weeks. You may remove the stockings at night, but should reapply them in the morning. Do not drive or operate any equipment until instructed.  WOUND CARE:  Continue to use the PolarCare or ice packs periodically to reduce pain and swelling. You may bathe or shower after the staples are removed at the first office visit following surgery.  MEDICATIONS: You may resume your regular medications. Please take the pain medication as prescribed on the medication. Do not take pain medication on an empty stomach. You have been given a prescription for a blood thinner (Lovenox or Coumadin). Please take the medication as instructed. (NOTE: After completing a 2 week course of Lovenox, take one Enteric-coated aspirin once a day. This along with elevation will help reduce the possibility of phlebitis in your operated leg.) Do not drive or drink alcoholic beverages when taking pain medications.  CALL THE OFFICE FOR: Temperature above 101 degrees Excessive bleeding or drainage on the dressing. Excessive swelling, coldness, or paleness of the toes. Persistent nausea and vomiting.  FOLLOW-UP:  You  should have an appointment to return to the office in 10-14 days after surgery. Arrangements have been made for continuation of Physical Therapy (either home therapy or outpatient therapy).   [facility] Department Directory         [email or web address]       [email or web address]          Cardiology  Appointments: [place] - [phone] [place] - [phone]  Endocrinology  Appointments: [place] - [phone] [place] - [phone]  Gastroenterology  Appointments: [place] - [phone] [place] - [phone]        General Surgery   Appointments: [place] - [phone]  Internal Medicine/Family Medicine  Appointments: [place] - [phone] [place] - [phone] [place] - [phone]  Metabolic and Weigh Loss Surgery  Appointments: [place] - [phone]        Neurology  Appointments: [place] - [phone] [place] - [phone]  Neurosurgery  Appointments: [place] - [phone]  Obstetrics & Gynecology  Appointments: [place] - [phone] [place] - [phone]        Pediatrics  Appointments: [place] - [phone] [place] - [phone]  Physiatry  Appointments: [place] -[phone]  Physical Therapy  Appointments: [place] - [phone] [place] - [phone]        Podiatry  Appointments: [place] - [phone] [place] - [phone]  Pulmonology  Appointments: [place] - [phone]  Rheumatology  Appointments: [place] - [phone]        [place] Location: [facility]  [address] , [place]  [address]  [place] Location: [facility] [address] [place], [place]  [address]  [place] Location: [facility] [address] [place], [place]  [address]    

## 2020-10-31 ENCOUNTER — Other Ambulatory Visit: Payer: Self-pay

## 2020-10-31 ENCOUNTER — Encounter
Admission: RE | Admit: 2020-10-31 | Discharge: 2020-10-31 | Disposition: A | Discharge status: Home / Self Care | Payer: Medicare Other | Source: Ambulatory Visit | Attending: Orthopedic Surgery | Admitting: Orthopedic Surgery

## 2020-10-31 ENCOUNTER — Encounter: Payer: Self-pay | Admitting: Orthopedic Surgery

## 2020-10-31 DIAGNOSIS — Z01818 Encounter for other preprocedural examination: Secondary | ICD-10-CM | POA: Diagnosis present

## 2020-10-31 DIAGNOSIS — Z112 Encounter for screening for other bacterial diseases: Secondary | ICD-10-CM | POA: Diagnosis not present

## 2020-10-31 HISTORY — DX: Atherosclerotic heart disease of native coronary artery without angina pectoris: I25.10

## 2020-10-31 LAB — CBC
HCT: 41.6 % (ref 39.0–52.0)
Hemoglobin: 15 g/dL (ref 13.0–17.0)
MCH: 32.5 pg (ref 26.0–34.0)
MCHC: 36.1 g/dL — ABNORMAL HIGH (ref 30.0–36.0)
MCV: 90 fL (ref 80.0–100.0)
Platelets: 156 10*3/uL (ref 150–400)
RBC: 4.62 MIL/uL (ref 4.22–5.81)
RDW: 13.3 % (ref 11.5–15.5)
WBC: 6 10*3/uL (ref 4.0–10.5)
nRBC: 0 % (ref 0.0–0.2)

## 2020-10-31 LAB — COMPREHENSIVE METABOLIC PANEL
ALT: 20 U/L (ref 0–44)
AST: 20 U/L (ref 15–41)
Albumin: 4 g/dL (ref 3.5–5.0)
Alkaline Phosphatase: 79 U/L (ref 38–126)
Anion gap: 9 (ref 5–15)
BUN: 23 mg/dL (ref 8–23)
CO2: 27 mmol/L (ref 22–32)
Calcium: 8.7 mg/dL — ABNORMAL LOW (ref 8.9–10.3)
Chloride: 101 mmol/L (ref 98–111)
Creatinine, Ser: 1.04 mg/dL (ref 0.61–1.24)
GFR, Estimated: >60 mL/min (ref >60)
Glucose, Bld: 135 mg/dL — ABNORMAL HIGH (ref 70–99)
Potassium: 3.3 mmol/L — ABNORMAL LOW (ref 3.5–5.1)
Sodium: 137 mmol/L (ref 135–145)
Total Bilirubin: 1.3 mg/dL — ABNORMAL HIGH (ref 0.3–1.2)
Total Protein: 7.6 g/dL (ref 6.5–8.1)

## 2020-10-31 LAB — TYPE AND SCREEN
ABO/RH(D): B POS
Antibody Screen: NEGATIVE

## 2020-10-31 LAB — URINALYSIS, ROUTINE W REFLEX MICROSCOPIC
Bilirubin Urine: NEGATIVE
Glucose, UA: NEGATIVE mg/dL
Hgb urine dipstick: NEGATIVE
Ketones, ur: NEGATIVE mg/dL
Leukocytes,Ua: NEGATIVE
Nitrite: NEGATIVE
Protein, ur: NEGATIVE mg/dL
Specific Gravity, Urine: 1.017 (ref 1.005–1.030)
pH: 5 (ref 5.0–8.0)

## 2020-10-31 LAB — SURGICAL PCR SCREEN
MRSA, PCR: NEGATIVE
Staphylococcus aureus: NEGATIVE

## 2020-10-31 LAB — PROTIME-INR
INR: 1 (ref 0.8–1.2)
Prothrombin Time: 12.7 seconds (ref 11.4–15.2)

## 2020-10-31 LAB — C-REACTIVE PROTEIN: CRP: 1.7 mg/dL — ABNORMAL HIGH (ref <1.0)

## 2020-10-31 LAB — SEDIMENTATION RATE: Sed Rate: 25 mm/hr — ABNORMAL HIGH (ref 0–20)

## 2020-10-31 LAB — APTT: aPTT: 34 seconds (ref 24–36)

## 2020-10-31 NOTE — Patient Instructions (Addendum)
Your procedure is scheduled on: 11/12/20 Report to the Registration Desk on the 1st floor of the [facility]. To find out your arrival time, please call [phone] between 1PM - 3PM on: 11/11/20 [facility] for Covid testing on 11/11/20 at 8:45 am  REMEMBER: Instructions that are not followed completely may result in serious medical risk, up to and including death; or upon the discretion of your surgeon and anesthesiologist your surgery may need to be rescheduled.  Do not eat food after midnight the night before surgery.  No gum chewing, lozengers or hard candies.  You may however, drink CLEAR liquids up to 2 hours before you are scheduled to arrive for your surgery. Do not drink anything within 2 hours of your scheduled arrival time.  Clear liquids include: - water  - apple juice without pulp - gatorade (not RED, PURPLE, OR BLUE) - black coffee or tea (Do NOT add milk or creamers to the coffee or tea) Do NOT drink anything that is not on this list.  In addition, your doctor has ordered for you to drink the provided  Ensure Pre-Surgery Clear Carbohydrate Drink  Drinking this carbohydrate drink up to two hours before surgery helps to reduce insulin resistance and improve patient outcomes. Please complete drinking 2 hours prior to scheduled arrival time.  TAKE THESE MEDICATIONS THE MORNING OF SURGERY WITH A SIP OF WATER: - amLODipine (NORVASC) 10 MG tablet - atorvastatin (LIPITOR) 40 MG tablet  Follow recommendations from Cardiologist, Pulmonologist or PCP regarding stopping Aspirin, Coumadin, Plavix, Eliquis, Pradaxa, or Pletal. Stop ASA 81 mg 1 week prior to procedure.  One week prior to surgery: Stop Anti-inflammatories (NSAIDS) such as Advil, Aleve, Ibuprofen, Motrin, Naproxen, Naprosyn and Aspirin based products such as Excedrin, Goodys Powder, BC Powder.  Stop ANY OVER THE COUNTER supplements until after surgery.  You may however, continue to take Tylenol if needed for  pain up until the day of surgery.  No Alcohol for 24 hours before or after surgery.  No Smoking including e-cigarettes for 24 hours prior to surgery.  No chewable tobacco products for at least 6 hours prior to surgery.  No nicotine patches on the day of surgery.  Do not use any "recreational" drugs for at least a week prior to your surgery.  Please be advised that the combination of cocaine and anesthesia may have negative outcomes, up to and including death. If you test positive for cocaine, your surgery will be cancelled.  On the morning of surgery brush your teeth with toothpaste and water, you may rinse your mouth with mouthwash if you wish. Do not swallow any toothpaste or mouthwash.  Do not wear jewelry, make-up, hairpins, clips or nail polish.  Do not wear lotions, powders, or perfumes.   Do not shave body from the neck down 48 hours prior to surgery just in case you cut yourself which could leave a site for infection.  Also, freshly shaved skin may become irritated if using the CHG soap.  Contact lenses, hearing aids and dentures may not be worn into surgery.  Do not bring valuables to the hospital. [facility] is not responsible for any missing/lost belongings or valuables.   Use CHG Soap or wipes as directed on instruction sheet.  Notify your doctor if there is any change in your medical condition (cold, fever, infection).  Wear comfortable clothing (specific to your surgery type) to the hospital.  After surgery, you can help prevent lung complications by doing breathing exercises.  Take  deep breaths and cough every 1-2 hours. Your doctor may order a device called an Incentive Spirometer to help you take deep breaths. When coughing or sneezing, hold a pillow firmly against your incision with both hands. This is called "splinting." Doing this helps protect your incision. It also decreases belly discomfort.  If you are being admitted to the hospital overnight, leave your  suitcase in the car. After surgery it may be brought to your room.  If you are being discharged the day of surgery, you will not be allowed to drive home. You will need a responsible adult (18 years or older) to drive you home and stay with you that night.   If you are taking public transportation, you will need to have a responsible adult (18 years or older) with you. Please confirm with your physician that it is acceptable to use public transportation.   Please call the [facility] at [phone] if you have any questions about these instructions.  Surgery Visitation Policy:  Patients undergoing a surgery or procedure may have one family member or support person with them as long as that person is not COVID-19 positive or experiencing its symptoms.  That person may remain in the waiting area during the procedure.  Inpatient Visitation:    Visiting hours are 7 a.m. to 8 p.m. Inpatients will be allowed two visitors daily. The visitors may change each day during the patient's stay. No visitors under the age of 106. Any visitor under the age of 28 must be accompanied by an adult. The visitor must pass COVID-19 screenings, use hand sanitizer when entering and exiting the patient's room and wear a mask at all times, including in the patient's room. Patients must also wear a mask when staff or their visitor are in the room. Masking is required regardless of vaccination status.

## 2020-11-02 LAB — URINE CULTURE
Culture: NO GROWTH
Special Requests: NORMAL

## 2020-11-10 ENCOUNTER — Inpatient Hospital Stay: Admission: RE | Admit: 2020-11-10 | Payer: MEDICARE | Source: Ambulatory Visit

## 2020-11-11 ENCOUNTER — Other Ambulatory Visit: Payer: Self-pay

## 2020-11-11 ENCOUNTER — Other Ambulatory Visit
Admission: RE | Admit: 2020-11-11 | Discharge: 2020-11-11 | Disposition: A | Discharge status: Home / Self Care | Payer: Medicare Other | Source: Ambulatory Visit | Attending: Orthopedic Surgery | Admitting: Orthopedic Surgery

## 2020-11-11 DIAGNOSIS — Z01812 Encounter for preprocedural laboratory examination: Secondary | ICD-10-CM | POA: Diagnosis present

## 2020-11-11 DIAGNOSIS — Z20822 Contact with and (suspected) exposure to covid-19: Secondary | ICD-10-CM | POA: Insufficient documentation

## 2020-11-11 LAB — SARS CORONAVIRUS 2 (TAT 6-24 HRS): SARS Coronavirus 2: NEGATIVE

## 2020-11-12 ENCOUNTER — Encounter
Admission: RE | Disposition: A | Discharge status: Home / Self Care | Payer: Self-pay | Source: Home / Self Care | Attending: Orthopedic Surgery

## 2020-11-12 ENCOUNTER — Ambulatory Visit: Payer: Medicare Other | Admitting: Urgent Care

## 2020-11-12 ENCOUNTER — Observation Stay: Payer: Medicare Other

## 2020-11-12 ENCOUNTER — Encounter: Payer: Self-pay | Admitting: Orthopedic Surgery

## 2020-11-12 ENCOUNTER — Observation Stay
Admission: RE | Admit: 2020-11-12 | Discharge: 2020-11-13 | Disposition: A | Discharge status: Home / Self Care | Payer: Medicare Other | Attending: Orthopedic Surgery | Admitting: Orthopedic Surgery

## 2020-11-12 DIAGNOSIS — Z7982 Long term (current) use of aspirin: Secondary | ICD-10-CM | POA: Diagnosis not present

## 2020-11-12 DIAGNOSIS — M25561 Pain in right knee: Secondary | ICD-10-CM | POA: Diagnosis present

## 2020-11-12 DIAGNOSIS — I10 Essential (primary) hypertension: Secondary | ICD-10-CM | POA: Insufficient documentation

## 2020-11-12 DIAGNOSIS — Z79899 Other long term (current) drug therapy: Secondary | ICD-10-CM | POA: Diagnosis not present

## 2020-11-12 DIAGNOSIS — Z96652 Presence of left artificial knee joint: Secondary | ICD-10-CM | POA: Diagnosis not present

## 2020-11-12 DIAGNOSIS — I2510 Atherosclerotic heart disease of native coronary artery without angina pectoris: Secondary | ICD-10-CM | POA: Diagnosis not present

## 2020-11-12 DIAGNOSIS — M1711 Unilateral primary osteoarthritis, right knee: Principal | ICD-10-CM | POA: Insufficient documentation

## 2020-11-12 DIAGNOSIS — Z96659 Presence of unspecified artificial knee joint: Secondary | ICD-10-CM

## 2020-11-12 HISTORY — PX: KNEE ARTHROPLASTY: SHX992

## 2020-11-12 HISTORY — DX: Testicular hypofunction: E29.1

## 2020-11-12 HISTORY — DX: Unspecified right bundle-branch block: I45.10

## 2020-11-12 HISTORY — DX: Unspecified osteoarthritis, unspecified site: M19.90

## 2020-11-12 HISTORY — DX: Male erectile dysfunction, unspecified: N52.9

## 2020-11-12 SURGERY — ARTHROPLASTY, KNEE, TOTAL, USING IMAGELESS COMPUTER-ASSISTED NAVIGATION
Anesthesia: Spinal | Site: Knee | Laterality: Right

## 2020-11-12 MED ORDER — SENNOSIDES-DOCUSATE SODIUM 8.6-50 MG PO TABS
1.0000 | ORAL_TABLET | Freq: Two times a day (BID) | ORAL | Status: DC
  Administered 2020-11-12 – 2020-11-13 (×3): 1 via ORAL
  Filled 2020-11-12 (×3): qty 1

## 2020-11-12 MED ORDER — FAMOTIDINE 20 MG PO TABS
ORAL_TABLET | ORAL | Status: AC
  Administered 2020-11-12: 20 mg via ORAL
  Filled 2020-11-12: qty 1

## 2020-11-12 MED ORDER — SODIUM CHLORIDE 0.9 % IV SOLN
INTRAVENOUS | Status: DC | PRN
  Administered 2020-11-12: 40 ug/min via INTRAVENOUS

## 2020-11-12 MED ORDER — TRANEXAMIC ACID-NACL 1000-0.7 MG/100ML-% IV SOLN
INTRAVENOUS | Status: DC | PRN
  Administered 2020-11-12: 1000 mg via INTRAVENOUS

## 2020-11-12 MED ORDER — PHENYLEPHRINE HCL (PRESSORS) 10 MG/ML IV SOLN
INTRAVENOUS | Status: AC
  Filled 2020-11-12: qty 1

## 2020-11-12 MED ORDER — SODIUM CHLORIDE 0.9 % IV SOLN: INTRAVENOUS | Status: DC

## 2020-11-12 MED ORDER — CEFAZOLIN SODIUM-DEXTROSE 2-4 GM/100ML-% IV SOLN
2.0000 g | Freq: Four times a day (QID) | INTRAVENOUS | Status: AC
Start: 2020-11-12 — End: 2020-11-12
  Administered 2020-11-12 (×2): 2 g via INTRAVENOUS
  Filled 2020-11-12 (×2): qty 100

## 2020-11-12 MED ORDER — SODIUM CHLORIDE FLUSH 0.9 % IV SOLN
INTRAVENOUS | Status: AC
  Filled 2020-11-12: qty 10

## 2020-11-12 MED ORDER — ONDANSETRON HCL 4 MG PO TABS: 4.0000 mg | ORAL_TABLET | Freq: Four times a day (QID) | ORAL | Status: DC | PRN

## 2020-11-12 MED ORDER — CEFAZOLIN SODIUM-DEXTROSE 2-4 GM/100ML-% IV SOLN: 2.0000 g | INTRAVENOUS | Status: DC

## 2020-11-12 MED ORDER — DIPHENHYDRAMINE HCL 12.5 MG/5ML PO ELIX: 12.5000 mg | ORAL_SOLUTION | ORAL | Status: DC | PRN

## 2020-11-12 MED ORDER — GLYCOPYRROLATE 0.2 MG/ML IJ SOLN
INTRAMUSCULAR | Status: DC | PRN
  Administered 2020-11-12: .2 mg via INTRAVENOUS

## 2020-11-12 MED ORDER — ENOXAPARIN SODIUM 30 MG/0.3ML IJ SOSY
30.0000 mg | PREFILLED_SYRINGE | Freq: Two times a day (BID) | INTRAMUSCULAR | Status: DC
  Administered 2020-11-13: 30 mg via SUBCUTANEOUS
  Filled 2020-11-12: qty 0.3

## 2020-11-12 MED ORDER — LACTATED RINGERS IV SOLN: INTRAVENOUS | Status: DC

## 2020-11-12 MED ORDER — TRANEXAMIC ACID-NACL 1000-0.7 MG/100ML-% IV SOLN
INTRAVENOUS | Status: AC
  Administered 2020-11-12: 1000 mg via INTRAVENOUS
  Filled 2020-11-12: qty 100

## 2020-11-12 MED ORDER — CHLORHEXIDINE GLUCONATE 0.12 % MT SOLN
OROMUCOSAL | Status: AC
  Administered 2020-11-12: 15 mL via OROMUCOSAL
  Filled 2020-11-12: qty 15

## 2020-11-12 MED ORDER — CEFAZOLIN SODIUM-DEXTROSE 2-3 GM-%(50ML) IV SOLR
INTRAVENOUS | Status: DC | PRN
  Administered 2020-11-12: 2 g via INTRAVENOUS

## 2020-11-12 MED ORDER — ACETAMINOPHEN 10 MG/ML IV SOLN
INTRAVENOUS | Status: AC
  Filled 2020-11-12: qty 100

## 2020-11-12 MED ORDER — CELECOXIB 200 MG PO CAPS
200.0000 mg | ORAL_CAPSULE | Freq: Two times a day (BID) | ORAL | Status: DC
  Administered 2020-11-12 – 2020-11-13 (×3): 200 mg via ORAL
  Filled 2020-11-12 (×3): qty 1

## 2020-11-12 MED ORDER — BUPIVACAINE HCL (PF) 0.5 % IJ SOLN
INTRAMUSCULAR | Status: DC | PRN
  Administered 2020-11-12: 3 mL via INTRATHECAL

## 2020-11-12 MED ORDER — CEFAZOLIN SODIUM-DEXTROSE 2-4 GM/100ML-% IV SOLN
INTRAVENOUS | Status: AC
  Filled 2020-11-12: qty 100

## 2020-11-12 MED ORDER — MIDAZOLAM HCL 5 MG/5ML IJ SOLN
INTRAMUSCULAR | Status: DC | PRN
  Administered 2020-11-12: 2 mg via INTRAVENOUS

## 2020-11-12 MED ORDER — TRANEXAMIC ACID-NACL 1000-0.7 MG/100ML-% IV SOLN: 1000.0000 mg | INTRAVENOUS | Status: DC

## 2020-11-12 MED ORDER — FENTANYL CITRATE (PF) 100 MCG/2ML IJ SOLN
INTRAMUSCULAR | Status: AC
  Administered 2020-11-12: 25 ug via INTRAVENOUS
  Filled 2020-11-12: qty 2

## 2020-11-12 MED ORDER — SODIUM CHLORIDE (PF) 0.9 % IJ SOLN
INTRAMUSCULAR | Status: DC | PRN
  Administered 2020-11-12: 120 mL via INTRAMUSCULAR

## 2020-11-12 MED ORDER — FLEET ENEMA 7-19 GM/118ML RE ENEM: 1.0000 | ENEMA | Freq: Once | RECTAL | Status: DC | PRN

## 2020-11-12 MED ORDER — MIDAZOLAM HCL 2 MG/2ML IJ SOLN
INTRAMUSCULAR | Status: AC
  Filled 2020-11-12: qty 2

## 2020-11-12 MED ORDER — PHENOL 1.4 % MT LIQD
1.0000 | OROMUCOSAL | Status: DC | PRN
  Filled 2020-11-12: qty 177

## 2020-11-12 MED ORDER — HYDROCHLOROTHIAZIDE 25 MG PO TABS
25.0000 mg | ORAL_TABLET | Freq: Every day | ORAL | Status: DC
  Administered 2020-11-12 – 2020-11-13 (×2): 25 mg via ORAL
  Filled 2020-11-12 (×2): qty 1

## 2020-11-12 MED ORDER — CHLORHEXIDINE GLUCONATE 0.12 % MT SOLN: 15.0000 mL | Freq: Once | OROMUCOSAL | Status: DC

## 2020-11-12 MED ORDER — BISACODYL 10 MG RE SUPP: 10.0000 mg | Freq: Every day | RECTAL | Status: DC | PRN

## 2020-11-12 MED ORDER — OXYCODONE HCL 5 MG PO TABS: 10.0000 mg | ORAL_TABLET | ORAL | Status: DC | PRN

## 2020-11-12 MED ORDER — STERILE WATER FOR IRRIGATION IR SOLN
Status: DC | PRN
  Administered 2020-11-12: 500 mL

## 2020-11-12 MED ORDER — LISINOPRIL-HYDROCHLOROTHIAZIDE 20-25 MG PO TABS: 2.0000 | ORAL_TABLET | Freq: Every day | ORAL | Status: DC

## 2020-11-12 MED ORDER — OXYCODONE HCL 5 MG PO TABS
5.0000 mg | ORAL_TABLET | Freq: Once | ORAL | Status: AC | PRN
  Administered 2020-11-12: 5 mg via ORAL

## 2020-11-12 MED ORDER — ONDANSETRON HCL 4 MG/2ML IJ SOLN: 4.0000 mg | Freq: Four times a day (QID) | INTRAMUSCULAR | Status: DC | PRN

## 2020-11-12 MED ORDER — CELECOXIB 200 MG PO CAPS: 400.0000 mg | ORAL_CAPSULE | Freq: Once | ORAL | Status: DC

## 2020-11-12 MED ORDER — SODIUM CHLORIDE 0.9 % IR SOLN
Status: DC | PRN
  Administered 2020-11-12: 3000 mL

## 2020-11-12 MED ORDER — CELECOXIB 200 MG PO CAPS
ORAL_CAPSULE | ORAL | Status: AC
  Administered 2020-11-12: 400 mg via ORAL
  Filled 2020-11-12: qty 1

## 2020-11-12 MED ORDER — OXYCODONE HCL 5 MG PO TABS
5.0000 mg | ORAL_TABLET | ORAL | Status: DC | PRN
  Administered 2020-11-13: 5 mg via ORAL
  Filled 2020-11-12: qty 1

## 2020-11-12 MED ORDER — ACETAMINOPHEN 10 MG/ML IV SOLN
INTRAVENOUS | Status: DC | PRN
  Administered 2020-11-12: 1000 mg via INTRAVENOUS

## 2020-11-12 MED ORDER — SODIUM CHLORIDE FLUSH 0.9 % IV SOLN
INTRAVENOUS | Status: AC
  Filled 2020-11-12: qty 80

## 2020-11-12 MED ORDER — ORAL CARE MOUTH RINSE: 15.0000 mL | Freq: Once | OROMUCOSAL | Status: DC

## 2020-11-12 MED ORDER — TRANEXAMIC ACID-NACL 1000-0.7 MG/100ML-% IV SOLN
INTRAVENOUS | Status: AC
  Filled 2020-11-12: qty 100

## 2020-11-12 MED ORDER — ACETAMINOPHEN 10 MG/ML IV SOLN: 1000.0000 mg | Freq: Once | INTRAVENOUS | Status: DC | PRN

## 2020-11-12 MED ORDER — GABAPENTIN 300 MG PO CAPS
ORAL_CAPSULE | ORAL | Status: AC
  Administered 2020-11-12: 300 mg via ORAL
  Filled 2020-11-12: qty 1

## 2020-11-12 MED ORDER — LISINOPRIL 20 MG PO TABS
20.0000 mg | ORAL_TABLET | Freq: Every day | ORAL | Status: DC
  Administered 2020-11-12 – 2020-11-13 (×2): 20 mg via ORAL
  Filled 2020-11-12 (×2): qty 1

## 2020-11-12 MED ORDER — SURGIPHOR WOUND IRRIGATION SYSTEM - OPTIME
TOPICAL | Status: DC | PRN
  Administered 2020-11-12: 1 via TOPICAL

## 2020-11-12 MED ORDER — DROPERIDOL 2.5 MG/ML IJ SOLN
0.6250 mg | Freq: Once | INTRAMUSCULAR | Status: DC | PRN
  Filled 2020-11-12: qty 2

## 2020-11-12 MED ORDER — ACETAMINOPHEN 325 MG PO TABS: 325.0000 mg | ORAL_TABLET | Freq: Four times a day (QID) | ORAL | Status: DC | PRN

## 2020-11-12 MED ORDER — ATORVASTATIN CALCIUM 20 MG PO TABS
40.0000 mg | ORAL_TABLET | Freq: Every day | ORAL | Status: DC
  Administered 2020-11-13: 40 mg via ORAL
  Filled 2020-11-12: qty 2

## 2020-11-12 MED ORDER — FERROUS SULFATE 325 (65 FE) MG PO TABS
325.0000 mg | ORAL_TABLET | Freq: Two times a day (BID) | ORAL | Status: DC
  Administered 2020-11-12 – 2020-11-13 (×2): 325 mg via ORAL
  Filled 2020-11-12 (×2): qty 1

## 2020-11-12 MED ORDER — CHLORHEXIDINE GLUCONATE 4 % EX LIQD: 60.0000 mL | Freq: Once | CUTANEOUS | Status: DC

## 2020-11-12 MED ORDER — ACETAMINOPHEN 10 MG/ML IV SOLN
1000.0000 mg | Freq: Four times a day (QID) | INTRAVENOUS | Status: AC
  Administered 2020-11-12 – 2020-11-13 (×3): 1000 mg via INTRAVENOUS
  Filled 2020-11-12 (×4): qty 100

## 2020-11-12 MED ORDER — PROPOFOL 500 MG/50ML IV EMUL
INTRAVENOUS | Status: DC | PRN
  Administered 2020-11-12: 60 ug/kg/min via INTRAVENOUS

## 2020-11-12 MED ORDER — BUPIVACAINE HCL (PF) 0.25 % IJ SOLN
INTRAMUSCULAR | Status: AC
  Filled 2020-11-12: qty 60

## 2020-11-12 MED ORDER — ALUM & MAG HYDROXIDE-SIMETH 200-200-20 MG/5ML PO SUSP: 30.0000 mL | ORAL | Status: DC | PRN

## 2020-11-12 MED ORDER — ENSURE PRE-SURGERY PO LIQD
296.0000 mL | Freq: Once | ORAL | Status: AC
  Administered 2020-11-12: 296 mL via ORAL
  Filled 2020-11-12: qty 296

## 2020-11-12 MED ORDER — OXYCODONE HCL 5 MG/5ML PO SOLN: 5.0000 mg | Freq: Once | ORAL | Status: AC | PRN

## 2020-11-12 MED ORDER — HYDROMORPHONE HCL 1 MG/ML IJ SOLN: 0.5000 mg | INTRAMUSCULAR | Status: DC | PRN

## 2020-11-12 MED ORDER — TRANEXAMIC ACID-NACL 1000-0.7 MG/100ML-% IV SOLN: 1000.0000 mg | Freq: Once | INTRAVENOUS | Status: AC

## 2020-11-12 MED ORDER — GABAPENTIN 300 MG PO CAPS: 300.0000 mg | ORAL_CAPSULE | Freq: Once | ORAL | Status: DC

## 2020-11-12 MED ORDER — PANTOPRAZOLE SODIUM 40 MG PO TBEC
40.0000 mg | DELAYED_RELEASE_TABLET | Freq: Two times a day (BID) | ORAL | Status: DC
  Administered 2020-11-12 – 2020-11-13 (×2): 40 mg via ORAL
  Filled 2020-11-12 (×2): qty 1

## 2020-11-12 MED ORDER — FENTANYL CITRATE (PF) 100 MCG/2ML IJ SOLN
25.0000 ug | INTRAMUSCULAR | Status: DC | PRN
  Administered 2020-11-12: 25 ug via INTRAVENOUS

## 2020-11-12 MED ORDER — BUPIVACAINE LIPOSOME 1.3 % IJ SUSP
INTRAMUSCULAR | Status: AC
  Filled 2020-11-12: qty 40

## 2020-11-12 MED ORDER — MAGNESIUM HYDROXIDE 400 MG/5ML PO SUSP
30.0000 mL | Freq: Every day | ORAL | Status: DC
  Administered 2020-11-12 – 2020-11-13 (×2): 30 mL via ORAL
  Filled 2020-11-12 (×2): qty 30

## 2020-11-12 MED ORDER — METOCLOPRAMIDE HCL 10 MG PO TABS
10.0000 mg | ORAL_TABLET | Freq: Three times a day (TID) | ORAL | Status: DC
  Administered 2020-11-12 – 2020-11-13 (×4): 10 mg via ORAL
  Filled 2020-11-12 (×4): qty 1

## 2020-11-12 MED ORDER — MENTHOL 3 MG MT LOZG
1.0000 | LOZENGE | OROMUCOSAL | Status: DC | PRN
  Filled 2020-11-12: qty 9

## 2020-11-12 MED ORDER — OXYCODONE HCL 5 MG PO TABS
ORAL_TABLET | ORAL | Status: AC
  Filled 2020-11-12: qty 1

## 2020-11-12 MED ORDER — AMLODIPINE BESYLATE 10 MG PO TABS
10.0000 mg | ORAL_TABLET | Freq: Every day | ORAL | Status: DC
  Administered 2020-11-13: 10 mg via ORAL
  Filled 2020-11-12: qty 1

## 2020-11-12 MED ORDER — DEXAMETHASONE SODIUM PHOSPHATE 10 MG/ML IJ SOLN: 8.0000 mg | Freq: Once | INTRAMUSCULAR | Status: DC

## 2020-11-12 MED ORDER — TRAMADOL HCL 50 MG PO TABS: 50.0000 mg | ORAL_TABLET | ORAL | Status: DC | PRN

## 2020-11-12 MED ORDER — DEXAMETHASONE SODIUM PHOSPHATE 10 MG/ML IJ SOLN
INTRAMUSCULAR | Status: AC
  Administered 2020-11-12: 8 mg via INTRAVENOUS
  Filled 2020-11-12: qty 1

## 2020-11-12 MED ORDER — FAMOTIDINE 20 MG PO TABS: 20.0000 mg | ORAL_TABLET | Freq: Once | ORAL | Status: AC

## 2020-11-12 MED ORDER — PROPOFOL 1000 MG/100ML IV EMUL
INTRAVENOUS | Status: AC
  Filled 2020-11-12: qty 100

## 2020-11-12 SURGICAL SUPPLY — 78 items
ATTUNE MED DOME PAT 41 KNEE (Knees) ×1 IMPLANT
ATTUNE PS FEM RT SZ 6 CEM KNEE (Femur) ×1 IMPLANT
ATTUNE PSRP INSR SZ6 6 KNEE (Insert) ×1 IMPLANT
BASE TIBIAL ROT PLAT SZ 8 KNEE (Knees) IMPLANT
BATTERY INSTRU NAVIGATION (MISCELLANEOUS) ×8 IMPLANT
BLADE CLIPPER SURG (BLADE) ×1 IMPLANT
BLADE SAW 70X12.5 (BLADE) ×2 IMPLANT
BLADE SAW 90X13X1.19 OSCILLAT (BLADE) ×2 IMPLANT
BLADE SAW 90X25X1.19 OSCILLAT (BLADE) ×2 IMPLANT
CEMENT BONE GENTAMICIN (Cement) IMPLANT
CEMENT HV SMART SET (Cement) ×2 IMPLANT
COOLER POLAR GLACIER W/PUMP (MISCELLANEOUS) ×2 IMPLANT
CUFF TOURN SGL QUICK 24 (TOURNIQUET CUFF)
CUFF TOURN SGL QUICK 34 (TOURNIQUET CUFF)
CUFF TRNQT CYL 24X4X16.5-23 (TOURNIQUET CUFF) IMPLANT
CUFF TRNQT CYL 34X4.125X (TOURNIQUET CUFF) IMPLANT
DRAPE 3/4 80X56 (DRAPES) ×2 IMPLANT
DRAPE INCISE IOBAN 66X45 STRL (DRAPES) ×1 IMPLANT
DRSG DERMACEA 8X12 NADH (GAUZE/BANDAGES/DRESSINGS) ×2 IMPLANT
DRSG MEPILEX SACRM 8.7X9.8 (GAUZE/BANDAGES/DRESSINGS) ×2 IMPLANT
DRSG OPSITE POSTOP 4X14 (GAUZE/BANDAGES/DRESSINGS) ×2 IMPLANT
DRSG TEGADERM 4X4.75 (GAUZE/BANDAGES/DRESSINGS) ×2 IMPLANT
DURAPREP 26ML APPLICATOR (WOUND CARE) ×4 IMPLANT
ELECT CAUTERY BLADE 6.4 (BLADE) ×2 IMPLANT
ELECT REM PT RETURN 9FT ADLT (ELECTROSURGICAL) ×2
ELECTRODE REM PT RTRN 9FT ADLT (ELECTROSURGICAL) ×1 IMPLANT
EX-PIN ORTHOLOCK NAV 4X150 (PIN) ×4 IMPLANT
GAUZE 4X4 16PLY ~~LOC~~+RFID DBL (SPONGE) ×2 IMPLANT
GLOVE SURG ENC MOIS LTX SZ7.5 (GLOVE) ×4 IMPLANT
GLOVE SURG ENC TEXT LTX SZ7.5 (GLOVE) ×4 IMPLANT
GLOVE SURG UNDER LTX SZ8 (GLOVE) ×2 IMPLANT
GLOVE SURG UNDER POLY LF SZ7.5 (GLOVE) ×2 IMPLANT
GOWN STRL REUS W/ TWL LRG LVL3 (GOWN DISPOSABLE) ×2 IMPLANT
GOWN STRL REUS W/ TWL XL LVL3 (GOWN DISPOSABLE) ×1 IMPLANT
GOWN STRL REUS W/TWL LRG LVL3 (GOWN DISPOSABLE) ×2
GOWN STRL REUS W/TWL XL LVL3 (GOWN DISPOSABLE) ×1
HEMOVAC 400CC 10FR (MISCELLANEOUS) ×2 IMPLANT
HOLDER FOLEY CATH W/STRAP (MISCELLANEOUS) ×2 IMPLANT
HOOD PEEL AWAY FLYTE STAYCOOL (MISCELLANEOUS) ×4 IMPLANT
IRRIGATION SURGIPHOR STRL (IV SOLUTION) ×2 IMPLANT
IV NS IRRIG 3000ML ARTHROMATIC (IV SOLUTION) ×2 IMPLANT
KIT TURNOVER KIT A (KITS) ×2 IMPLANT
KNIFE SCULPS 14X20 (INSTRUMENTS) ×2 IMPLANT
LABEL OR SOLS (LABEL) ×2 IMPLANT
MANIFOLD NEPTUNE II (INSTRUMENTS) ×4 IMPLANT
NDL SAFETY ECLIPSE 18X1.5 (NEEDLE) ×1 IMPLANT
NDL SPNL 20GX3.5 QUINCKE YW (NEEDLE) ×2 IMPLANT
NEEDLE HYPO 18GX1.5 SHARP (NEEDLE) ×1
NEEDLE SPNL 20GX3.5 QUINCKE YW (NEEDLE) ×4 IMPLANT
NS IRRIG 500ML POUR BTL (IV SOLUTION) ×1 IMPLANT
PACK TOTAL KNEE (MISCELLANEOUS) ×2 IMPLANT
PAD WRAPON POLAR KNEE (MISCELLANEOUS) ×1 IMPLANT
PENCIL SMOKE EVACUATOR COATED (MISCELLANEOUS) ×2 IMPLANT
PIN DRILL FIX HALF THREAD (BIT) ×4 IMPLANT
PIN DRILL QUICK PACK ×2 IMPLANT
PIN FIXATION 1/8DIA X 3INL (PIN) ×2 IMPLANT
PULSAVAC PLUS IRRIG FAN TIP (DISPOSABLE) ×2
SOL PREP PVP 2OZ (MISCELLANEOUS) ×2
SOLUTION PREP PVP 2OZ (MISCELLANEOUS) ×1 IMPLANT
SPONGE DRAIN TRACH 4X4 STRL 2S (GAUZE/BANDAGES/DRESSINGS) ×2 IMPLANT
SPONGE T-LAP 18X18 ~~LOC~~+RFID (SPONGE) ×6 IMPLANT
STAPLER SKIN PROX 35W (STAPLE) ×2 IMPLANT
STOCKINETTE IMPERV 14X48 (MISCELLANEOUS) IMPLANT
STRAP TIBIA SHORT (MISCELLANEOUS) ×2 IMPLANT
SUCTION FRAZIER HANDLE 10FR (MISCELLANEOUS) ×1
SUCTION TUBE FRAZIER 10FR DISP (MISCELLANEOUS) ×1 IMPLANT
SUT VIC AB 0 CT1 36 (SUTURE) ×4 IMPLANT
SUT VIC AB 1 CT1 36 (SUTURE) ×4 IMPLANT
SUT VIC AB 2-0 CT2 27 (SUTURE) ×2 IMPLANT
SYR 20ML LL LF (SYRINGE) ×2 IMPLANT
SYR 30ML LL (SYRINGE) ×4 IMPLANT
TIBIAL BASE ROT PLAT SZ 8 KNEE (Knees) ×2 IMPLANT
TIP FAN IRRIG PULSAVAC PLUS (DISPOSABLE) ×1 IMPLANT
TOWEL OR 17X26 4PK STRL BLUE (TOWEL DISPOSABLE) ×2 IMPLANT
TOWER CARTRIDGE SMART MIX (DISPOSABLE) ×2 IMPLANT
TRAY FOLEY MTR SLVR 16FR STAT (SET/KITS/TRAYS/PACK) ×2 IMPLANT
WATER STERILE IRR 500ML POUR (IV SOLUTION) ×2 IMPLANT
WRAPON POLAR PAD KNEE (MISCELLANEOUS) ×2

## 2020-11-12 NOTE — Evaluation (Signed)
Physical Therapy Evaluation Patient Details Name: Mario Wilson MRN: [id number] DOB: [birth year] Today's Date: 11/12/2020   History of Present Illness  Pt is a 70 yo M diagnosed with degenerative arthrosis of the right knee and is s/p elective R TKA.  PMH includes: HTN, CAD, cardiac stent placement, L TKA 08/2019, osteoarthritis, and dysrhythmias.   Clinical Impression  Pt was pleasant and motivated to participate during the session and overall performed very well during the session. Pt required no physical assistance with functional tasks and reported no adverse symptoms during the session other than mild R knee pain.  Pt was steady with both transfers and gait and was able to easily ambulate in the room approximately 12 feet with excellent balance and without any instability in the R knee.  Pt is expected to make very good progress towards goals while in acute care and will benefit from HHPT upon discharge to safely address deficits listed in patient problem list for decreased caregiver assistance and eventual return to PLOF.      Follow Up Recommendations Home health PT;Supervision - Intermittent    Equipment Recommendations  None recommended by PT    Recommendations for Other Services       Precautions / Restrictions Precautions Precautions: Fall Restrictions Weight Bearing Restrictions: Yes RLE Weight Bearing: Weight bearing as tolerated Other Position/Activity Restrictions: Pt able to perform Ind RLE SLRs without extensor lag, no KI required.      Mobility  Bed Mobility Overal bed mobility: Modified Independent             General bed mobility comments: Min extra time and effort only    Transfers Overall transfer level: Needs assistance Equipment used: Rolling walker (2 wheeled) Transfers: Sit to/from Stand Sit to Stand: Min guard         General transfer comment: Min verbal and visual cues for sequencing; pt demonstrated good eccentric and concentric control  and stability  Ambulation/Gait Ambulation/Gait assistance: Min guard Gait Distance (Feet): 10 Feet Assistive device: Rolling walker (2 wheeled) Gait Pattern/deviations: Step-to pattern;Decreased stance time - right Gait velocity: decreased   General Gait Details: Pt able to amb with good stability with no knee buckling adverse symptoms with min verbal cues for amb closer to the [employer] Mobility    Modified Rankin (Stroke Patients Only)       Balance Overall balance assessment: Needs assistance   Sitting balance-Leahy Scale: Normal     Standing balance support: Bilateral upper extremity supported;During functional activity Standing balance-Leahy Scale: Good                               Pertinent Vitals/Pain Pain Assessment: 0-10 Pain Score: 2  Pain Location: R knee Pain Descriptors / Indicators: Aching;Sore Pain Intervention(s): Premedicated before session;Monitored during session;Repositioned;Ice applied    Home Living Family/patient expects to be discharged to:: Private residence Living Arrangements: Other relatives (nephew) Available Help at Discharge: Family;Available 24 hours/day Type of Home: House Home Access: Stairs to enter Entrance Stairs-Rails: None Entrance Stairs-Number of Steps: 1 small step Home Layout: One level Home Equipment: Walker - 2 wheels;Bedside commode      Prior Function Level of Independence: Independent         Comments: Ind amb without an AD community distances, Ind with ADLs, no fall history, active and likes to golf but was limited by R knee  pain     Hand Dominance        Extremity/Trunk Assessment   Upper Extremity Assessment Upper Extremity Assessment: Overall WFL for tasks assessed    Lower Extremity Assessment Lower Extremity Assessment: RLE deficits/detail RLE Deficits / Details: BLE ankle AROM, strength, and sensation to light touch grossly intact; RLE hip flex  strength >/= 3/5 RLE: Unable to fully assess due to pain RLE Sensation: WNL RLE Coordination: WNL       Communication   Communication: No difficulties  Cognition Arousal/Alertness: Awake/alert Behavior During Therapy: WFL for tasks assessed/performed Overall Cognitive Status: Within Functional Limits for tasks assessed                                        General Comments      Exercises Total Joint Exercises Ankle Circles/Pumps: AROM;Strengthening;Both;10 reps Quad Sets: AROM;Strengthening;Right;5 reps;10 reps Straight Leg Raises: Strengthening;Right;AROM;5 reps Long Arc Quad: AROM;Strengthening;Right;10 reps Knee Flexion: AROM;Strengthening;Right;10 reps Goniometric ROM: R knee AROM: 6-78 deg Marching in Standing: AROM;Strengthening;Both;5 reps;Standing Other Exercises Other Exercises: HEP education per handout Other Exercises: Positioning education to promote R knee ext PROM and prevent heel pressure   Assessment/Plan    PT Assessment Patient needs continued PT services  PT Problem List Decreased strength;Decreased range of motion;Decreased activity tolerance;Decreased balance;Decreased mobility;Decreased knowledge of use of DME;Pain       PT Treatment Interventions DME instruction;Gait training;Stair training;Functional mobility training;Therapeutic activities;Therapeutic exercise;Balance training;Patient/family education    PT Goals (Current goals can be found in the Care Plan section)  Acute Rehab PT Goals Patient Stated Goal: To get back to golfing PT Goal Formulation: With patient Time For Goal Achievement: 11/25/20 Potential to Achieve Goals: Good    Frequency BID   Barriers to discharge        Co-evaluation               AM-PAC PT "6 Clicks" Mobility  Outcome Measure Help needed turning from your back to your side while in a flat bed without using bedrails?: A Little Help needed moving from lying on your back to sitting on the  side of a flat bed without using bedrails?: A Little Help needed moving to and from a bed to a chair (including a wheelchair)?: A Little Help needed standing up from a chair using your arms (e.g., wheelchair or bedside chair)?: A Little Help needed to walk in hospital room?: A Little Help needed climbing 3-5 steps with a railing? : A Little 6 Click Score: 18    End of Session Equipment Utilized During Treatment: Gait belt Activity Tolerance: Patient tolerated treatment well Patient left: in chair;with call bell/phone within reach;with chair alarm set;with SCD's reapplied;Other (comment) (Polar care donned to R knee) Nurse Communication: Mobility status;Weight bearing status PT Visit Diagnosis: Other abnormalities of gait and mobility (R26.89);Muscle weakness (generalized) (M62.81);Pain Pain - Right/Left: Right Pain - part of body: Knee    Time: 1557-1640 PT Time Calculation (min) (ACUTE ONLY): 43 min   Charges:   PT Evaluation $PT Eval Moderate Complexity: 1 Mod PT Treatments $Therapeutic Exercise: 8-22 mins $Therapeutic Activity: 8-22 mins       D. Elly Modena PT, DPT 11/12/20, 5:01 PM

## 2020-11-12 NOTE — H&P (Signed)
The patient has been re-examined, and the chart reviewed, and there have been no interval changes to the documented history and physical.    The risks, benefits, and alternatives have been discussed at length. The patient expressed understanding of the risks benefits and agreed with plans for surgical intervention.  Michayla Mcneil P. Adylee Leonardo, Jr. M.D.    

## 2020-11-12 NOTE — Anesthesia Preprocedure Evaluation (Addendum)
Anesthesia Evaluation  Patient identified by MRN, date of birth, ID band Patient awake    Reviewed: Allergy & Precautions, NPO status , Patient's Chart, lab work & pertinent test results, reviewed documented beta blocker date and time   History of Anesthesia Complications Negative for: history of anesthetic complications  Airway Mallampati: II  TM Distance: >3 FB Neck ROM: Full    Dental  (+) Poor Dentition, Missing,    Pulmonary Current Smoker and Patient abstained from smoking.,    Pulmonary exam normal breath sounds clear to auscultation       Cardiovascular Exercise Tolerance: Good hypertension, Pt. on medications (-) angina+ CAD and + Cardiac Stents (stents x 2 approximately 2014-2015)  (-) DOE Normal cardiovascular exam+ dysrhythmias (RBBB)  Rhythm:Regular Rate:Normal   HLD   Neuro/Psych negative neurological ROS  negative psych ROS   GI/Hepatic negative GI ROS, Neg liver ROS, neg GERD  ,  Endo/Other  negative endocrine ROS  Renal/GU negative Renal ROS  negative genitourinary   Musculoskeletal  (+) Arthritis , Osteoarthritis,    Abdominal (+) + obese (BMI 36),  Abdomen: soft.    Peds  Hematology negative hematology ROS (+)   Anesthesia Other Findings   Reproductive/Obstetrics                            Anesthesia Physical  Anesthesia Plan  ASA: III  Anesthesia Plan: Spinal and General/Spinal   Post-op Pain Management:    Induction: Intravenous  PONV Risk Score and Plan: 1 and Propofol infusion, TIVA and Treatment may vary due to age or medical condition  Airway Management Planned: Natural Airway and Nasal Cannula  Additional Equipment:   Intra-op Plan:   Post-operative Plan:   Informed Consent: I have reviewed the patients History and Physical, chart, labs and discussed the procedure including the risks, benefits and alternatives for the proposed anesthesia with  the patient or authorized representative who has indicated his/her understanding and acceptance.     Dental advisory given  Plan Discussed with: CRNA and Anesthesiologist  Anesthesia Plan Comments:        Anesthesia Quick Evaluation  Patient Active Problem List   Diagnosis Date Noted  . Status post total left knee replacement 08/20/2019  . History of coronary artery stent placement 08/16/2019  . Severe obesity (BMI 35.0-39.9) with comorbidity (HCC) 08/16/2019  . Primary osteoarthritis of right knee 09/03/2018  . 2-vessel coronary artery disease 02/05/2015  . Erectile dysfunction 02/05/2015  . Essential hypertension 02/05/2015  . Hx of hypogonadism 02/05/2015  . Mixed hyperlipidemia 02/05/2015    CBC Latest Ref Rng & Units 10/31/2020 08/08/2019  WBC 4.0 - 10.5 K/uL 6.0 5.5  Hemoglobin 13.0 - 17.0 g/dL 95.0 93.2  Hematocrit 67.1 - 52.0 % 41.6 39.9  Platelets 150 - 400 K/uL 156 149(L)   BMP Latest Ref Rng & Units 10/31/2020 08/08/2019  Glucose 70 - 99 mg/dL 245(Y) 099(I)  BUN 8 - 23 mg/dL 23 33(A)  Creatinine 2.50 - 1.24 mg/dL 5.39 7.67  Sodium 341 - 145 mmol/L 137 137  Potassium 3.5 - 5.1 mmol/L 3.3(L) 3.5  Chloride 98 - 111 mmol/L 101 101  CO2 22 - 32 mmol/L 27 25  Calcium 8.9 - 10.3 mg/dL 9.3(X) 8.9    Risks and benefits of anesthesia discussed at length, patient or surrogate demonstrates understanding. Appropriately NPO. Plan to proceed with anesthesia.  Corlis Leak, MD 11/12/20

## 2020-11-12 NOTE — H&P (Signed)
ORTHOPAEDIC HISTORY & PHYSICAL Michelene Gardener, [place] - 10/30/2020 9:45 AM EDT Formatting of this note is different from the original. [facility] Chief Complaint:   Chief Complaint  Patient presents with   Knee Pain  H & P RIGHT KNEE   History of Present Illness:   Mario Wilson is a 70 y.o. male that presents to clinic today for his preoperative history and evaluation. Patient presents with his niece. The patient is scheduled to undergo a right total knee arthroplasty on 11/12/20 by Dr. Ernest Pine. His pain began several years ago and is increased over the last year. The pain is located primarily along the medial aspect of the knee. He describes his pain as worse with weightbearing. He reports associated swelling with some giving way of the knee. He denies associated numbness or tingling, denies locking.   The patient's symptoms have progressed to the point that they decrease his quality of life. The patient has previously undergone conservative treatment including NSAIDS and injections to the knee without adequate control of his symptoms.  Patient denies history of lower back surgery, blood clots. Does report history of cardiac stents placed around 15 years ago but does not follow with a cardiologist at this time. No known drug allergies.  Patient is not diabetic.  Patient also requests evaluation of the left knee. He reports an audible clicking that can sometimes be heard as well as intermittent mild pain felt laterally. Denies any injury to the knee. Confirms swelling, denies any giving way of the knee.  Past Medical, Surgical, Family, Social History, Allergies, Medications:   Past Medical History:  Past Medical History:  Diagnosis Date   Hyperlipidemia   Hypertension   Past Surgical History:  Past Surgical History:  Procedure Laterality Date   ARTHROPLASTY HIP TOTAL Left 02/2014   coronary stent 2015   Left total knee arthroplasty  using computer-assisted navigation 08/20/2019  Dr Ernest Pine   Current Medications:  Current Outpatient Medications  Medication Sig Dispense Refill   amLODIPine (NORVASC) 10 MG tablet Take 1 tablet by mouth once daily 90 tablet 2   aspirin 81 MG EC tablet Take 81 mg by mouth once daily   atorvastatin (LIPITOR) 40 MG tablet Take 1 tablet by mouth once daily 90 tablet 2   lisinopriL-hydrochlorothiazide (ZESTORETIC) 20-25 mg tablet Take 1 tablet by mouth twice daily 180 tablet 2   zolpidem (AMBIEN) 5 MG tablet Take 5 mg by mouth nightly as needed for Sleep   No current facility-administered medications for this visit.   Allergies: No Known Allergies  Social History:  Social History   Socioeconomic History   Marital status: Divorced   Number of children: 1   Years of education: 16   Highest education level: Bachelor's degree (e.g., BA, AB, BS)  Occupational History   Occupation: Retired  Tobacco Use   Smoking status: Current Every Day Smoker  Packs/day: 1.00  Years: 10.00  Pack years: 10.00  Types: Cigars, Cigarettes  Last attempt to quit: 1990  Years since quitting: 32.6   Smokeless tobacco: Never Used   Tobacco comment: Smokes 3 cigars a day now  Advertising account planner   Vaping Use: Never used  Substance and Sexual Activity   Alcohol use: Yes  Alcohol/week: 6.0 standard drinks  Types: 6 Cans of beer per week  Comment: under 6 per week   Drug use: No   Sexual activity: Defer  Partners: Female  Social History Narrative  Married, 1 son  Retired from Architect  1 cigar per day x6 months  No hx of smoking otherwise  4 drinks per week  Caffeine: 1 per day  Exercise: workouts 5x/week   Family History:  Family History  Problem Relation Age of Onset   No Known Problems Mother   No Known Problems Father   Review of Systems:   A 10+ ROS was performed, reviewed, and the pertinent orthopaedic findings are documented in the HPI.   Physical Examination:   BP (!)  140/86 (BP Location: Left upper arm, Patient Position: Sitting, BP Cuff Size: Large Adult)  Ht 182.9 cm (6')  Wt (!) 115.7 kg (255 lb)  BMI 34.58 kg/m   Patient is a well-developed, well-nourished male in no acute distress. Patient has normal mood and affect. Patient is alert and oriented to person, place, and time.   HEENT: Atraumatic, normocephalic. Pupils equal and reactive to light. Extraocular motion intact. Noninjected sclera.  Cardiovascular: Regular rate and rhythm, with no murmurs, rubs, or gallops. Distal pulses palpable. No bruits.  Respiratory: Lungs clear to auscultation bilaterally.   Right Knee: Soft tissue swelling: minimal Effusion: none Erythema: none Crepitance: mild Tenderness: medial Alignment: relative varus Mediolateral laxity: medial pseudolaxity Posterior sag: negative Patellar tracking: Good tracking without evidence of subluxation or tilt Atrophy: No significant atrophy.  Quadriceps tone was good. Range of motion: 0/2/126 degrees  Sensation intact over the saphenous, lateral sural cutaneous, superficial fibular, and deep fibular nerve distributions.  Mild effusion noted over the left knee. Range of motion examination of the knee reveals the patient has 123 degrees of flexion and lacks 1 degree of extension. No pain with passive or active range of motion. The patient is nontender over the medial and lateral joint lines. Knee is stable to varus and valgus stresses in full extension. Stable to anterior-posterior translation. Patella tracking is good. Quad tone is fair to good.  Tests Performed/Reviewed:  X-rays  Previous x-rays of the right knee were reviewed and showed complete loss of medial compartment joint space with bone-on-bone contact, osteophyte formation, and subchondral sclerosis. Mild loss of lateral compartment joint space noted. Lateral joint space well-preserved. Mild spurring noted of the patella noted on lateral view.  Previous views of  the left knee were reviewed and showed a left total knee arthroplasty without signs of loosening, migration, or failure. No periprosthetic lucency or periprosthetic fractures noted.  Impression:   ICD-10-CM  1. 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 Primary osteoarthritis of right knee M17.11   Plan:   The patient has end-stage degenerative changes of the right knee. It was explained to the patient that the condition is progressive in nature. Having failed conservative treatment, the patient has elected to proceed with a total joint arthroplasty. The patient will undergo a total joint arthroplasty with Dr. Ernest Pine. The risks of surgery, including blood clot and infection, were discussed with the patient. Measures to reduce these risks, including the use of anticoagulation, perioperative antibiotics, and early ambulation were discussed. The importance of postoperative physical therapy was discussed with the patient. The patient elects to proceed with surgery. The patient is instructed to stop all blood thinners prior to surgery. The patient is instructed to call the hospital the day before surgery to learn of the proper arrival time.   -Status post left total knee replacement -Left knee effusion Advised patient that discomfort may be due to insufficient quadriceps tone. Reviewed and demonstrated exercises for quadriceps strengthening with the patient. Also advised  use of Polar Care and elevation to decrease swelling.  Contact our office with any questions or concerns. Follow up as indicated, or sooner should any new problems arise, if conditions worsen, or if they are otherwise concerned.   Michelene Gardener, PA -[facility] [address] [place], [place] [address] Phone: [phone]  This note was generated in part with voice recognition software and I apologize for any typographical errors that were not detected and  corrected.  Electronically signed by Michelene Gardener, PA at 10/30/2020 3:07 PM EDT

## 2020-11-12 NOTE — Op Note (Signed)
OPERATIVE NOTE  DATE OF SURGERY:  11/12/2020  PATIENT NAME:  Mario Wilson   DOB: 10-04-1950  MRN: [id number]  PRE-OPERATIVE DIAGNOSIS: Degenerative arthrosis of the right knee, primary  POST-OPERATIVE DIAGNOSIS:  Same  PROCEDURE:  Right total knee arthroplasty using computer-assisted navigation  SURGEON:  Jena Gauss. M.D.  ASSISTANT: Baldwin Jamaica, PA-C (present and scrubbed throughout the case, critical for assistance with exposure, retraction, instrumentation, and closure)  ANESTHESIA: spinal  ESTIMATED BLOOD LOSS: 50 mL  FLUIDS REPLACED: 1500 mL of crystalloid  TOURNIQUET TIME: 90 minutes  DRAINS: 2 medium Hemovac drains  SOFT TISSUE RELEASES: Anterior cruciate ligament, posterior cruciate ligament, deep  medial collateral ligament, patellofemoral ligament  IMPLANTS UTILIZED: DePuy Attune size 6 posterior stabilized femoral component (cemented), size 8 rotating platform tibial component (cemented), 41 mm medialized dome patella (cemented), and a 6 mm stabilized rotating platform polyethylene insert.  INDICATIONS FOR SURGERY: Mario Wilson is a 70 y.o. year old male with a long history of progressive knee pain. X-rays demonstrated severe degenerative changes in tricompartmental fashion. The patient had not seen any significant improvement despite conservative nonsurgical intervention. After discussion of the risks and benefits of surgical intervention, the patient expressed understanding of the risks benefits and agree with plans for total knee arthroplasty.   The risks, benefits, and alternatives were discussed at length including but not limited to the risks of infection, bleeding, nerve injury, stiffness, blood clots, the need for revision surgery, cardiopulmonary complications, among others, and they were willing to proceed.  PROCEDURE IN DETAIL: The patient was brought into the operating room and, after adequate spinal anesthesia was achieved, a tourniquet was placed on  the patient's upper thigh. The patient's knee and leg were cleaned and prepped with alcohol and DuraPrep and draped in the usual sterile fashion. A "timeout" was performed as per usual protocol. The lower extremity was exsanguinated using an Esmarch, and the tourniquet was inflated to 300 mmHg. An anterior longitudinal incision was made followed by a standard mid vastus approach. The deep fibers of the medial collateral ligament were elevated in a subperiosteal fashion off of the medial flare of the tibia so as to maintain a continuous soft tissue sleeve. The patella was subluxed laterally and the patellofemoral ligament was incised. Inspection of the knee demonstrated severe degenerative changes with full-thickness loss of articular cartilage. Osteophytes were debrided using a rongeur. Anterior and posterior cruciate ligaments were excised. Two 4.0 mm Schanz pins were inserted in the femur and into the tibia for attachment of the array of trackers used for computer-assisted navigation. Hip center was identified using a circumduction technique. Distal landmarks were mapped using the computer. The distal femur and proximal tibia were mapped using the computer. The distal femoral cutting guide was positioned using computer-assisted navigation so as to achieve a 5 distal valgus cut. The femur was sized and it was felt that a size 6 femoral component was appropriate. A size 6 femoral cutting guide was positioned and the anterior cut was performed and verified using the computer. This was followed by completion of the posterior and chamfer cuts. Femoral cutting guide for the central box was then positioned in the center box cut was performed.  Attention was then directed to the proximal tibia. Medial and lateral menisci were excised. The extramedullary tibial cutting guide was positioned using computer-assisted navigation so as to achieve a 0 varus-valgus alignment and 3 posterior slope. The cut was performed and  verified using the computer. The proximal tibia was sized  and it was felt that a size 8 tibial tray was appropriate. Tibial and femoral trials were inserted followed by insertion of a 6 mm polyethylene insert. This allowed for excellent mediolateral soft tissue balancing both in flexion and in full extension. Finally, the patella was cut and prepared so as to accommodate a 41 mm medialized dome patella. A patella trial was placed and the knee was placed through a range of motion with excellent patellar tracking appreciated. The femoral trial was removed after debridement of posterior osteophytes. The central post-hole for the tibial component was reamed followed by insertion of a keel punch. Tibial trials were then removed. Cut surfaces of bone were irrigated with copious amounts of normal saline using pulsatile lavage and then suctioned dry. Polymethylmethacrylate cement was prepared in the usual fashion using a vacuum mixer. Cement was applied to the cut surface of the proximal tibia as well as along the undersurface of a size 8 rotating platform tibial component. Tibial component was positioned and impacted into place. Excess cement was removed using Personal assistant. Cement was then applied to the cut surfaces of the femur as well as along the posterior flanges of the size 6 femoral component. The femoral component was positioned and impacted into place. Excess cement was removed using Personal assistant. A 6 mm polyethylene trial was inserted and the knee was brought into full extension with steady axial compression applied. Finally, cement was applied to the backside of a 41 mm medialized dome patella and the patellar component was positioned and patellar clamp applied. Excess cement was removed using Personal assistant. After adequate curing of the cement, the tourniquet was deflated after a total tourniquet time of 90 minutes. Hemostasis was achieved using electrocautery. The knee was irrigated with copious amounts  of normal saline using pulsatile lavage followed by 500 ml of Surgiphor and then suctioned dry. 20 mL of 1.3% Exparel and 60 mL of 0.25% Marcaine in 40 mL of normal saline was injected along the posterior capsule, medial and lateral gutters, and along the arthrotomy site. A 6 mm stabilized rotating platform polyethylene insert was inserted and the knee was placed through a range of motion with excellent mediolateral soft tissue balancing appreciated and excellent patellar tracking noted. 2 medium drains were placed in the wound bed and brought out through separate stab incisions. The medial parapatellar portion of the incision was reapproximated using interrupted sutures of #1 Vicryl. Subcutaneous tissue was approximated in layers using first #0 Vicryl followed #2-0 Vicryl. The skin was approximated with skin staples. A sterile dressing was applied.  The patient tolerated the procedure well and was transported to the recovery room in stable condition.    Zyaire Dumas P. Angie Fava., M.D.

## 2020-11-12 NOTE — Transfer of Care (Signed)
Immediate Anesthesia Transfer of Care Note  Patient: Mario Wilson  Procedure(s) Performed: COMPUTER ASSISTED TOTAL KNEE ARTHROPLASTY (Right: Knee)  Patient Location: PACU  Anesthesia Type:Spinal  Level of Consciousness: awake, alert  and oriented  Airway & Oxygen Therapy: Patient Spontanous Breathing and Patient connected to face mask oxygen  Post-op Assessment: Report given to RN and Post -op Vital signs reviewed and stable  Post vital signs: Reviewed and stable  Last Vitals:  Vitals Value Taken Time  BP    Temp    Pulse 80 11/12/20 1212  Resp 22 11/12/20 1212  SpO2 92 % 11/12/20 1212  Vitals shown include unvalidated device data.  Last Pain:  Vitals:   11/12/20 0721  TempSrc: Tympanic  PainSc: 0-No pain      Patients Stated Pain Goal: 0 (11/12/20 0721)  Complications: No notable events documented.

## 2020-11-12 NOTE — Anesthesia Procedure Notes (Signed)
Spinal  Patient location during procedure: OR Start time: 11/12/2020 8:25 AM End time: 11/12/2020 8:30 AM Reason for block: surgical anesthesia Staffing Performed: resident/CRNA  Resident/CRNA: Nelda Marseille, CRNA Preanesthetic Checklist Completed: patient identified, IV checked, site marked, risks and benefits discussed, surgical consent, monitors and equipment checked, pre-op evaluation and timeout performed Spinal Block Patient position: sitting Prep: Betadine Patient monitoring: heart rate, continuous pulse ox, blood pressure and cardiac monitor Approach: midline Location: L3-4 Injection technique: single-shot Needle Needle type: Whitacre and Introducer  Needle gauge: 25 G Needle length: 9 cm Assessment Sensory level: T8 Events: CSF return Additional Notes Negative paresthesia. Negative blood return. Positive free-flowing CSF. Expiration date of kit checked and confirmed. Patient tolerated procedure well, without complications.

## 2020-11-12 NOTE — Anesthesia Postprocedure Evaluation (Signed)
Anesthesia Post Note  Patient: Moataz Morrisette  Procedure(s) Performed: COMPUTER ASSISTED TOTAL KNEE ARTHROPLASTY (Right: Knee)  Patient location during evaluation: PACU Anesthesia Type: Spinal Level of consciousness: awake and alert Pain management: pain level controlled Vital Signs Assessment: post-procedure vital signs reviewed and stable Respiratory status: spontaneous breathing, nonlabored ventilation and respiratory function stable Cardiovascular status: blood pressure returned to baseline and stable Postop Assessment: no apparent nausea or vomiting Anesthetic complications: no   No notable events documented.   Last Vitals:  Vitals:   11/12/20 1315 11/12/20 1355  BP: (!) 141/85 (!) 137/100  Pulse: 82 95  Resp: 20 16  Temp: (!) 36.3 C 36.6 C  SpO2: 92% 92%    Last Pain:  Vitals:   11/12/20 1315  TempSrc:   PainSc: 4                  Foye Deer

## 2020-11-13 DIAGNOSIS — M1711 Unilateral primary osteoarthritis, right knee: Secondary | ICD-10-CM | POA: Diagnosis not present

## 2020-11-13 MED ORDER — CELECOXIB 200 MG PO CAPS: 200.0000 mg | ORAL_CAPSULE | Freq: Two times a day (BID) | ORAL | 0 refills | Status: DC

## 2020-11-13 MED ORDER — OXYCODONE HCL 5 MG PO TABS: 5.0000 mg | ORAL_TABLET | ORAL | 0 refills | Status: DC | PRN

## 2020-11-13 MED ORDER — TRAMADOL HCL 50 MG PO TABS: 50.0000 mg | ORAL_TABLET | ORAL | 0 refills | Status: DC | PRN

## 2020-11-13 MED ORDER — ENOXAPARIN SODIUM 40 MG/0.4ML IJ SOSY: 40.0000 mg | PREFILLED_SYRINGE | INTRAMUSCULAR | 0 refills | Status: DC

## 2020-11-13 NOTE — Discharge Summary (Signed)
Physician Discharge Summary  Patient ID: Mario Wilson MRN: [id number] DOB/AGE: 1950-10-13 70 y.o.  Admit date: 11/12/2020 Discharge date: 11/13/2020  Admission Diagnoses:  Total knee replacement status [Z96.659]  Surgeries:Procedure(s): Right total knee arthroplasty using computer-assisted navigation   SURGEON:  Jena Gauss. M.D.   ASSISTANT: Baldwin Jamaica, PA-C (present and scrubbed throughout the case, critical for assistance with exposure, retraction, instrumentation, and closure)   ANESTHESIA: spinal   ESTIMATED BLOOD LOSS: 50 mL   FLUIDS REPLACED: 1500 mL of crystalloid   TOURNIQUET TIME: 90 minutes   DRAINS: 2 medium Hemovac drains   SOFT TISSUE RELEASES: Anterior cruciate ligament, posterior cruciate ligament, deep  medial collateral ligament, patellofemoral ligament   IMPLANTS UTILIZED: DePuy Attune size 6 posterior stabilized femoral component (cemented), size 8 rotating platform tibial component (cemented), 41 mm medialized dome patella (cemented), and a 6 mm stabilized rotating platform polyethylene insert.  Discharge Diagnoses: Patient Active Problem List   Diagnosis Date Noted   Total knee replacement status 11/12/2020   Status post total left knee replacement 08/20/2019   History of coronary artery stent placement 08/16/2019   Severe obesity (BMI 35.0-39.9) with comorbidity (HCC) 08/16/2019   Primary osteoarthritis of right knee 09/03/2018   2-vessel coronary artery disease 02/05/2015   Erectile dysfunction 02/05/2015   Essential hypertension 02/05/2015   Hx of hypogonadism 02/05/2015   Mixed hyperlipidemia 02/05/2015    Past Medical History:  Diagnosis Date   Coronary artery disease    stents x 2 placed in [place] in approximately 2014-2015   ED (erectile dysfunction)    Hyperlipidemia    Hypertension    Hypogonadism in male    Osteoarthritis    RBBB (right bundle branch block)      Transfusion:    Consultants (if any):   Discharged  Condition: Improved  Hospital Course: Mario Wilson is an 70 y.o. male who was admitted 11/12/2020 with a diagnosis of right knee osteoarthritis and went to the operating room on 11/12/2020 and underwent right total knee arthroplasty. The patient received perioperative antibiotics for prophylaxis (see below). The patient tolerated the procedure well and was transported to PACU in stable condition. After meeting PACU criteria, the patient was subsequently transferred to the Orthopaedics/Rehabilitation unit.   The patient received DVT prophylaxis in the form of early mobilization, Lovenox, Foot Pumps, and TED hose. A sacral pad had been placed and heels were elevated off of the bed with rolled towels in order to protect skin integrity. Foley catheter was discontinued on postoperative day #0. Wound drains were discontinued on postoperative day #1. The surgical incision was healing well without signs of infection.  Physical therapy was initiated postoperatively for transfers, gait training, and strengthening. Occupational therapy was initiated for activities of daily living and evaluation for assisted devices. Rehabilitation goals were reviewed in detail with the patient. The patient made steady progress with physical therapy and physical therapy recommended discharge to Home.   The patient achieved the preliminary goals of this hospitalization and was felt to be medically and orthopaedically appropriate for discharge.  He was given perioperative antibiotics:  Anti-infectives (From admission, onward)    Start     Dose/Rate Route Frequency Ordered Stop   11/12/20 1500  ceFAZolin (ANCEF) IVPB 2g/100 mL premix        2 g 200 mL/hr over 30 Minutes Intravenous Every 6 hours 11/12/20 1345 11/12/20 2218   11/12/20 0719  ceFAZolin (ANCEF) 2-4 GM/100ML-% IVPB       Note to Pharmacy: Okey Dupre  Bass, Cryst: cabinet override      11/12/20 0719 11/12/20 1929   11/12/20 0600  ceFAZolin (ANCEF) IVPB 2g/100 mL premix   Status:  Discontinued        2 g 200 mL/hr over 30 Minutes Intravenous On call to O.R. 11/12/20 1610 11/12/20 0704     .  Recent vital signs:  Vitals:   11/13/20 0351 11/13/20 0835  BP: 111/68 126/81  Pulse: 75 76  Resp: 16 16  Temp: 97.9 F (36.6 C) 97.7 F (36.5 C)  SpO2: 95% 96%    Recent laboratory studies:  No results for input(s): WBC, HGB, HCT, PLT, K, CL, CO2, BUN, CREATININE, GLUCOSE, CALCIUM, LABPT, INR in the last 72 hours.  Diagnostic Studies: DG Knee Right Port  Result Date: 11/12/2020 CLINICAL DATA:  Postop right knee. EXAM: PORTABLE RIGHT KNEE - 1-2 VIEW COMPARISON:  None. FINDINGS: Right knee arthroplasty in expected alignment. No periprosthetic lucency or fracture. There has been patellar resurfacing. Ghost tracks in the proximal tibia and likely distal femur. Recent postsurgical change includes air and edema in the soft tissues and joint space. Surgical drain in the suprapatellar region. Anterior skin staples. IMPRESSION: Right knee arthroplasty without immediate postoperative complication. Electronically Signed   By: Narda Rutherford M.D.   On: 11/12/2020 13:12    Discharge Medications:   Allergies as of 11/13/2020   No Known Allergies      Medication List     STOP taking these medications    aspirin EC 81 MG tablet       TAKE these medications    amLODipine 10 MG tablet Commonly known as: NORVASC Take 10 mg by mouth daily.   atorvastatin 40 MG tablet Commonly known as: LIPITOR Take 40 mg by mouth daily.   celecoxib 200 MG capsule Commonly known as: CELEBREX Take 1 capsule (200 mg total) by mouth 2 (two) times daily.   enoxaparin 40 MG/0.4ML injection Commonly known as: LOVENOX Inject 0.4 mLs (40 mg total) into the skin daily for 14 days.   lisinopril-hydrochlorothiazide 20-25 MG tablet Commonly known as: ZESTORETIC Take 2 tablets by mouth daily.   oxyCODONE 5 MG immediate release tablet Commonly known as: Oxy IR/ROXICODONE Take 1  tablet (5 mg total) by mouth every 4 (four) hours as needed for severe pain (pain score 4-6).   traMADol 50 MG tablet Commonly known as: ULTRAM Take 1 tablet (50 mg total) by mouth every 4 (four) hours as needed for moderate pain.               Durable Medical Equipment  (From admission, onward)           Start     Ordered   11/12/20 1345  DME Walker rolling  Once       Question:  Patient needs a walker to treat with the following condition  Answer:  Total knee replacement status   11/12/20 1345   11/12/20 1345  DME Bedside commode  Once       Question:  Patient needs a bedside commode to treat with the following condition  Answer:  Total knee replacement status   11/12/20 1345            Disposition: Home with home health PT     Follow-up Information     Madelyn Flavors, PA-C Follow up on 11/27/2020.   Specialty: Orthopedic Surgery Why: at 1:45pm Contact information: [address] [facility] [place] [address]  [phone]         Donato Heinz, MD Follow up on 12/25/2020.   Specialty: Orthopedic Surgery Why: at 2:00pm Contact information: [address] [facility] [place] [address] [phone]                  Lasandra Beech, PA-C 11/13/2020, 11:40 AM

## 2020-11-13 NOTE — Plan of Care (Signed)
Post-op dressing removed. , Hemovac removed., and Mini compression dressing applied.    

## 2020-11-13 NOTE — Progress Notes (Signed)
  Subjective: 1 Day Post-Op Procedure(s) (LRB): COMPUTER ASSISTED TOTAL KNEE ARTHROPLASTY (Right) Patient reports pain as well-controlled.   Patient is well, and has had no acute complaints or problems Plan is to go Home after hospital stay. Negative for chest pain and shortness of breath Fever: no Gastrointestinal: negative for nausea and vomiting.  Patient has not had a bowel movement.  Objective: Vital signs in last 24 hours: Temp:  [97.3 F (36.3 C)-97.9 F (36.6 C)] 97.9 F (36.6 C) (09/08 0351) Pulse Rate:  [75-107] 75 (09/08 0351) Resp:  [16-26] 16 (09/08 0351) BP: (111-148)/(68-100) 111/68 (09/08 0351) SpO2:  [90 %-96 %] 95 % (09/08 0351)  Intake/Output from previous day:  Intake/Output Summary (Last 24 hours) at 11/13/2020 0757 Last data filed at 11/13/2020 0600 Gross per 24 hour  Intake 1750 ml  Output 1750 ml  Net 0 ml    Intake/Output this shift: No intake/output data recorded.  Labs: No results for input(s): HGB in the last 72 hours. No results for input(s): WBC, RBC, HCT, PLT in the last 72 hours. No results for input(s): NA, K, CL, CO2, BUN, CREATININE, GLUCOSE, CALCIUM in the last 72 hours. No results for input(s): LABPT, INR in the last 72 hours.   EXAM General - Patient is Alert, Appropriate, and Oriented Extremity - Neurovascular intact Dorsiflexion/Plantar flexion intact Compartment soft Dressing/Incision -Postoperative dressing remains in place., Polar Care in place and working. , Hemovac in place.  Motor Function - intact, moving foot and toes well on exam. Able to perform independent SLR.  Cardiovascular- Regular rate and rhythm, no murmurs/rubs/gallops Respiratory- Lungs clear to auscultation bilaterally Gastrointestinal- soft, nontender, and active bowel sounds   Assessment/Plan: 1 Day Post-Op Procedure(s) (LRB): COMPUTER ASSISTED TOTAL KNEE ARTHROPLASTY (Right) Active Problems:   Total knee replacement status  Estimated body mass index  is 33.91 kg/m as calculated from the following:   Height as of this encounter: 6' (1.829 m).   Weight as of this encounter: 113.4 kg. Advance diet Up with therapy Discharge home with home health pending PT goals. Likely today       DVT Prophylaxis - Lovenox, Ted hose, and foot pumps Weight-Bearing as tolerated to right leg  Baldwin Jamaica, PA-C [facility] Orthopaedic Surgery 11/13/2020, 7:57 AM

## 2020-11-13 NOTE — Progress Notes (Signed)
Blood pressure 126/81, pulse 76, temperature 97.7 F (36.5 C), resp. rate 16, height 6' (1.829 m), weight 113.4 kg, SpO2 96 %. VSS, IV cath removed site c/d/I. Pt d/c with bone foam, polar care and two honeycomb dressings. Pt scripts electronically sent to [facility] by MD. Pt transported via with all belongings down to private car.

## 2020-11-13 NOTE — Progress Notes (Addendum)
Physical Therapy Treatment Patient Details Name: Mario Wilson MRN: [id number] DOB: [birth year] Today's Date: 11/13/2020    History of Present Illness Pt is a 70 yo M diagnosed with degenerative arthrosis of the right knee and is s/p elective R TKA.  PMH includes: HTN, CAD, cardiac stent placement, L TKA 08/2019, osteoarthritis, and dysrhythmias.    PT Comments    Pt was sitting in recliner upon arriving. He agrees to PT session and is extremely motivated and cooperative. Endorses no pain. Pt demonstrated safe ability to stand, ambulate, and perform all requested task. Was easily able to ascend/descend steps 2 x without physical assistance or safety concern. Issued HEP and pt easily able to perform without assistance. ROM progressing well. Lacks 4 degrees extension but was able to flex to 87 degrees. Pt is cleared from an acute PT standpoint for safety DC home with HHPT to continue to progress strength, ROM, and safety with ADLs.    Follow Up Recommendations  Home health PT;Supervision - Intermittent     Equipment Recommendations  None recommended by PT       Precautions / Restrictions Precautions Precautions: Fall Restrictions Weight Bearing Restrictions: Yes RLE Weight Bearing: Weight bearing as tolerated    Mobility  Bed Mobility Overal bed mobility: Modified Independent    General bed mobility comments: NT, up in recliner    Transfers Overall transfer level: Modified independent Equipment used: Rolling walker (2 wheeled) Transfers: Sit to/from Stand Sit to Stand: Modified independent (Device/Increase time)         General transfer comment: Pt was easily and safely able to stand and sit without physical assistance  Ambulation/Gait Ambulation/Gait assistance: Supervision Gait Distance (Feet): 200 Feet Assistive device: Rolling walker (2 wheeled) Gait Pattern/deviations: Step-to pattern;Decreased stance time - right Gait velocity: decreased   General Gait Details: Pt  was safely and easily able to ambulate with RW without difficulty or safety concerns   Stairs Stairs: Yes Stairs assistance: Supervision Stair Management: Step to pattern;Backwards;Forwards;With walker;No rails Number of Stairs: 1 General stair comments: pt demonstrated safe ability to ascend/descend 1 step to simulate home entry. performed 2 x both fwd and backwards    Balance Overall balance assessment: Needs assistance Sitting-balance support: Bilateral upper extremity supported Sitting balance-Leahy Scale: Normal     Standing balance support: Bilateral upper extremity supported;During functional activity Standing balance-Leahy Scale: Good      Cognition Arousal/Alertness: Awake/alert Behavior During Therapy: WFL for tasks assessed/performed Overall Cognitive Status: Within Functional Limits for tasks assessed      General Comments: Pt A and O x 4      Exercises Total Joint Exercises Ankle Circles/Pumps: AROM;Strengthening;Both;10 reps Quad Sets: AROM;Strengthening;Right;5 reps;10 reps Knee Flexion: AROM;Strengthening;Right;10 reps Goniometric ROM: 4-87 Other Exercises Other Exercises: Pt educated in falls prevention, pet care considerations, AE/DME, home/routines modifications for ADL, compression stocking mgt, and polar care mgt. Handout provided to support recall and carryover.     Pertinent Vitals/Pain Pain Assessment: No/denies pain Pain Score: 0-No pain Pain Location: R knee Pain Descriptors / Indicators: Aching;Sore Pain Intervention(s): Limited activity within patient's tolerance;Monitored during session;Premedicated before session;Repositioned;Ice applied    Home Living Family/patient expects to be discharged to:: Private residence Living Arrangements: Other relatives (nephew) Available Help at Discharge: Family;Available 24 hours/day Type of Home: House Home Access: Stairs to enter Entrance Stairs-Rails: None Home Layout: One level Home Equipment:  Environmental consultant - 2 wheels;Bedside commode      Prior Function Level of Independence: Independent      Comments: Ind  amb without an AD community distances, Ind with ADLs, no fall history, active and likes to golf but was limited by R knee pain   PT Goals (current goals can now be found in the care plan section) Acute Rehab PT Goals Patient Stated Goal: Get back to living Progress towards PT goals: Progressing toward goals    Frequency    BID      PT Plan Current plan remains appropriate       AM-PAC PT "6 Clicks" Mobility   Outcome Measure  Help needed turning from your back to your side while in a flat bed without using bedrails?: A Little Help needed moving from lying on your back to sitting on the side of a flat bed without using bedrails?: A Little Help needed moving to and from a bed to a chair (including a wheelchair)?: A Little Help needed standing up from a chair using your arms (e.g., wheelchair or bedside chair)?: A Little Help needed to walk in hospital room?: A Little Help needed climbing 3-5 steps with a railing? : A Little 6 Click Score: 18    End of Session Equipment Utilized During Treatment: Gait belt Activity Tolerance: Patient tolerated treatment well Patient left: in chair;with call bell/phone within reach;with chair alarm set;with SCD's reapplied;Other (comment) Nurse Communication: Mobility status;Weight bearing status PT Visit Diagnosis: Other abnormalities of gait and mobility (R26.89);Muscle weakness (generalized) (M62.81);Pain Pain - Right/Left: Right Pain - part of body: Knee     Time: 1610-9604 PT Time Calculation (min) (ACUTE ONLY): 15 min  Charges:  $Gait Training: 8-22 mins                     Jetta Lout PTA 11/13/20, 9:57 AM

## 2020-11-13 NOTE — Evaluation (Signed)
Occupational Therapy Evaluation Patient Details Name: Mario Wilson MRN: [id number] DOB: 1950-05-01 Today's Date: 11/13/2020    History of Present Illness Pt is a 70 yo M diagnosed with degenerative arthrosis of the right knee and is s/p elective R TKA.  PMH includes: HTN, CAD, cardiac stent placement, L TKA 08/2019, osteoarthritis, and dysrhythmias.   Clinical Impression   Pt seen for OT evaluation this date, POD#1 from above surgery. Pt was independent in all ADL prior to surgery and is eager to return to PLOF including golfing with less pain and improved safety and independence. Pt currently requires PRN MIN A for LB dressing and bathing without AE while in seated position due to pain and limited AROM of R knee. Pt instructed in polar care mgt, falls prevention strategies, home/routines modifications, DME/AE for LB bathing and dressing tasks, pet care considerations, and compression stocking mgt. Handout provided to support recall and carryover. Pt verbalized understanding, denied additional needs/concerns at this time. No additional skilled OT needs, will sign off.      Follow Up Recommendations  No OT follow up    Equipment Recommendations  None recommended by OT    Recommendations for Other Services       Precautions / Restrictions Precautions Precautions: Fall Restrictions Weight Bearing Restrictions: Yes RLE Weight Bearing: Weight bearing as tolerated      Mobility Bed Mobility               General bed mobility comments: NT, up in recliner    Transfers                 General transfer comment: Pt declined    Balance                                           ADL either performed or assessed with clinical judgement   ADL                                         General ADL Comments: Pt currently requires PRN MIN A for LB ADL, Min-MOD A for compression stockings. Pt reports family able to provide needed level of  assist.     Vision Patient Visual Report: No change from baseline       Perception     Praxis      Pertinent Vitals/Pain Pain Assessment: 0-10 Pain Score: 2  Pain Location: R knee Pain Descriptors / Indicators: Aching;Sore Pain Intervention(s): Limited activity within patient's tolerance;Monitored during session;Premedicated before session;Repositioned;Ice applied     Hand Dominance Right   Extremity/Trunk Assessment Upper Extremity Assessment Upper Extremity Assessment: Overall WFL for tasks assessed   Lower Extremity Assessment Lower Extremity Assessment: RLE deficits/detail RLE Deficits / Details: s/p TKA RLE Sensation: WNL       Communication Communication Communication: No difficulties   Cognition Arousal/Alertness: Awake/alert Behavior During Therapy: WFL for tasks assessed/performed Overall Cognitive Status: Within Functional Limits for tasks assessed                                     General Comments       Exercises Other Exercises Other Exercises: Pt educated in falls prevention, pet care considerations, AE/DME, home/routines modifications  for ADL, compression stocking mgt, and polar care mgt. Handout provided to support recall and carryover.   Shoulder Instructions      Home Living Family/patient expects to be discharged to:: Private residence Living Arrangements: Other relatives (nephew) Available Help at Discharge: Family;Available 24 hours/day Type of Home: House Home Access: Stairs to enter [employer] of Steps: 1 small step Entrance Stairs-Rails: None Home Layout: One level     Bathroom Shower/Tub: Producer, television/film/video: Handicapped height     Home Equipment: Environmental consultant - 2 wheels;Bedside commode          Prior Functioning/Environment Level of Independence: Independent        Comments: Ind amb without an AD community distances, Ind with ADLs, no fall history, active and likes to golf but was  limited by R knee pain        OT Problem List: Decreased strength;Pain;Decreased range of motion      OT Treatment/Interventions:      OT Goals(Current goals can be found in the care plan section) Acute Rehab OT Goals Patient Stated Goal: To get back to golfing OT Goal Formulation: All assessment and education complete, DC therapy  OT Frequency:     Barriers to D/C:            Co-evaluation              AM-PAC OT "6 Clicks" Daily Activity     Outcome Measure Help from another person eating meals?: None Help from another person taking care of personal grooming?: None Help from another person toileting, which includes using toliet, bedpan, or urinal?: A Little Help from another person bathing (including washing, rinsing, drying)?: A Little Help from another person to put on and taking off regular upper body clothing?: None Help from another person to put on and taking off regular lower body clothing?: A Little 6 Click Score: 21   End of Session    Activity Tolerance: Patient tolerated treatment well Patient left: in chair;with call bell/phone within reach;with chair alarm set;Other (comment) (polar care in place)  OT Visit Diagnosis: Other abnormalities of gait and mobility (R26.89);Pain Pain - Right/Left: Right Pain - part of body: Knee                Time: 4098-1191 OT Time Calculation (min): 9 min Charges:  OT General Charges $OT Visit: 1 Visit OT Evaluation $OT Eval Low Complexity: 1 Low  Wynona Canes, MPH, MS, OTR/L ascom [phone] 11/13/20, 9:28 AM

## 2022-12-15 IMAGING — DX DG KNEE 1-2V PORT*R*
4 series · 4 of 4 positions shown · non-contrast
Comparison: None.

CLINICAL DATA: Postop right knee.

EXAM:
PORTABLE RIGHT KNEE - 1-2 VIEW

[knee obl (1 of 2)]
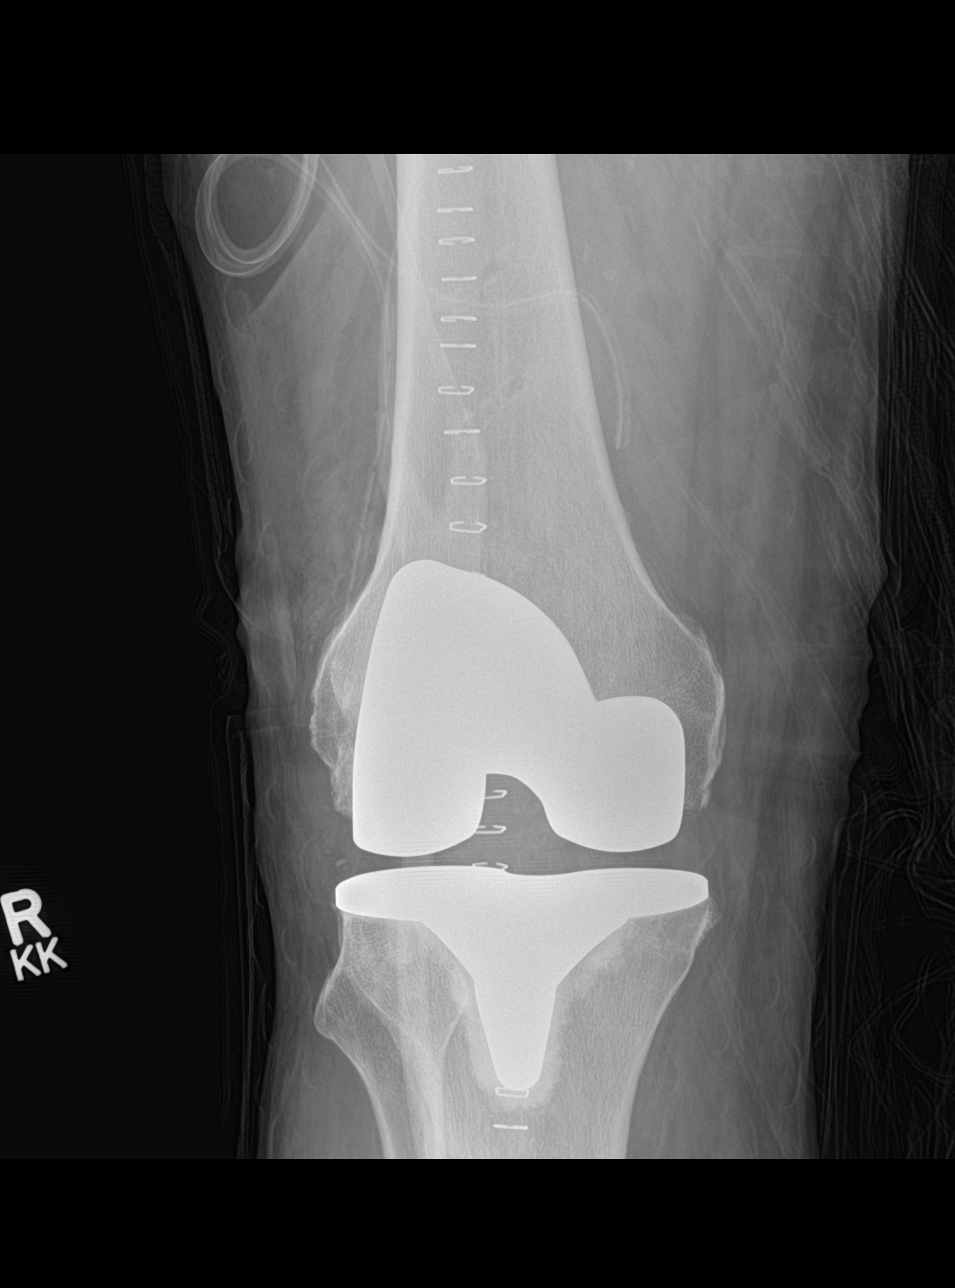

[knee lat]
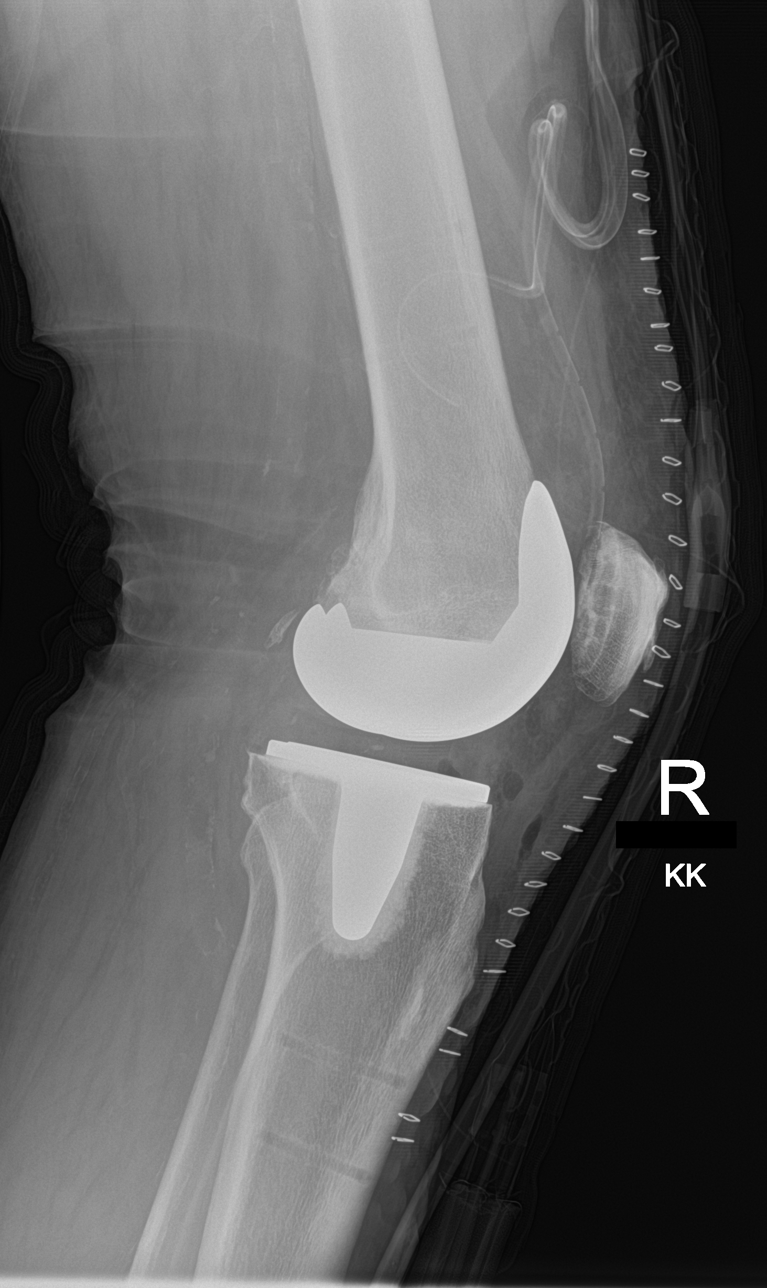

[knee ap]
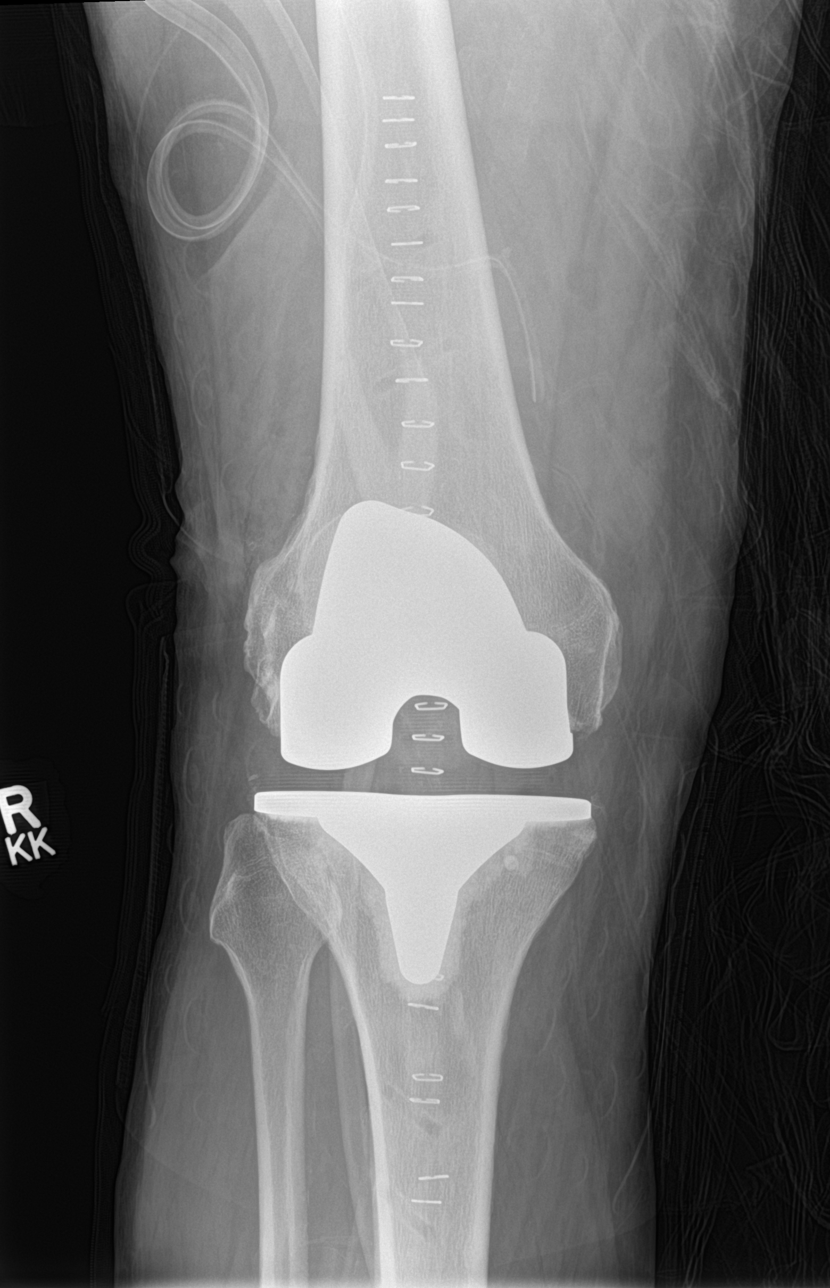

[knee obl (2 of 2)]
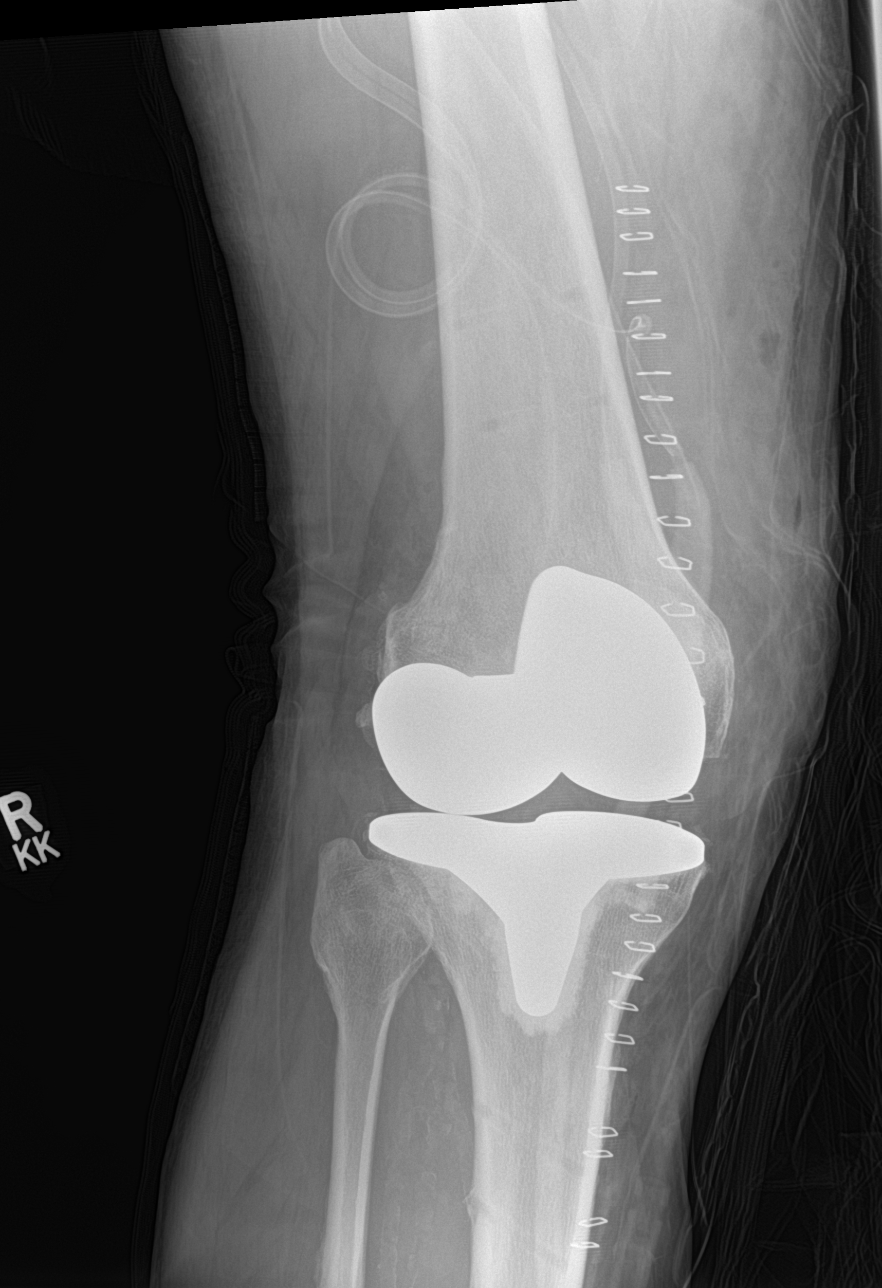

[4 of 4 positions shown; findings below may reference images not displayed]

FINDINGS: Right knee arthroplasty in expected alignment. No periprosthetic
lucency or fracture. There has been patellar resurfacing. Ghost
tracks in the proximal tibia and likely distal femur. Recent
postsurgical change includes air and edema in the soft tissues and
joint space. Surgical drain in the suprapatellar region. Anterior
skin staples.
IMPRESSION: Right knee arthroplasty without immediate postoperative
complication.

## 2023-08-23 ENCOUNTER — Other Ambulatory Visit: Payer: Self-pay | Admitting: Family Medicine

## 2023-08-23 DIAGNOSIS — M5412 Radiculopathy, cervical region: Secondary | ICD-10-CM

## 2023-08-24 ENCOUNTER — Ambulatory Visit
Admission: RE | Admit: 2023-08-24 | Discharge: 2023-08-24 | Disposition: A | Discharge status: Home / Self Care | Source: Ambulatory Visit | Attending: Family Medicine | Admitting: Family Medicine

## 2023-08-24 DIAGNOSIS — M5412 Radiculopathy, cervical region: Secondary | ICD-10-CM | POA: Insufficient documentation

## 2023-08-24 NOTE — Progress Notes (Incomplete)
 mri

## 2023-08-25 ENCOUNTER — Other Ambulatory Visit: Payer: Self-pay | Admitting: Family Medicine

## 2023-08-25 ENCOUNTER — Inpatient Hospital Stay
Admission: RE | Admit: 2023-08-25 | Discharge: 2023-08-25 | Disposition: A | Discharge status: Home / Self Care | Payer: Self-pay | Source: Ambulatory Visit | Attending: Neurosurgery | Admitting: Neurosurgery

## 2023-08-25 DIAGNOSIS — Z049 Encounter for examination and observation for unspecified reason: Secondary | ICD-10-CM

## 2023-08-31 NOTE — H&P (View-Only) (Signed)
 Referring Physician:  [facility] [address] [place],  [place] [address]  Primary Physician:  [facility]  History of Present Illness: 09/01/2023 [name] is here today with a chief complaint of difficulty with balance.  He has severe neck pain extending into his right shoulder.  He has been having problems for 4 months.  His balance has been getting worse.  He has tried some physical therapy without improvement in his neck pain.  He is also having some difficulty lifting his big toes.  His feet feel different than they previously did.  He has had some near falls. Bowel/Bladder Dysfunction: none  Conservative measures:  Physical therapy: is currently in PT at [facility] Multimodal medical therapy including regular antiinflammatories: Oxycodone , Tramadol , Celebrex   Injections: 08/12/2023: RFA to the bilateral L4-5 and L5-S1 facet joints 07/22/2023: MBB to the bilateral L4-5 and L5-S1 facet joints (7/10 to 1/10) 07/08/2023: MBB to the bilateral L4-5 and L5-S1 facet joints (7/10 to 1-2/10) 05/26/2023: Bilateral L4-5 transforaminal ESI (no relief, dexamethasone  12) 05/05/2023: Right C2-3 and C3-4 facet joint injections (50% relief Dr. Georgianna)   Past Surgery: none  Mario Wilson has symptoms of cervical myelopathy.  The symptoms are causing a significant impact on the patient's life.   I have utilized the care everywhere function in epic to review the outside records available from external health systems.  Review of Systems:  A 10 point review of systems is negative, except for the pertinent positives and negatives detailed in the HPI.  Past Medical History: Past Medical History:  Diagnosis Date   Coronary artery disease    stents x 2 placed in [place]  in approximately 2014-2015   ED (erectile dysfunction)    Hyperlipidemia    Hypertension    Hypogonadism in male    Osteoarthritis    RBBB (right bundle branch block)     Past Surgical  History: Past Surgical History:  Procedure Laterality Date   CARDIAC CATHETERIZATION  2015   stent   CORONARY ANGIOPLASTY     EXCISION MORTON'S NEUROMA Left 07/04/2019   Procedure: EXCISION MORTON'S NEUROMA LEFT TOE;  Surgeon: Ashley Soulier, DPM;  Location: [facility];  Service: Podiatry;  Laterality: Left;  LOCAL WITH IV   EXCISION MORTON'S NEUROMA Left 08/06/2020   Procedure: EXCISION MORTON'S NEUROMA- LEFT FT X2;  Surgeon: Ashley Soulier, DPM;  Location: [facility];  Service: Podiatry;  Laterality: Left;  GENERAL WITH LOCAL   KNEE ARTHROPLASTY Left 08/20/2019   Procedure: COMPUTER ASSISTED TOTAL KNEE ARTHROPLASTY;  Surgeon: Mardee Lynwood SQUIBB, MD;  Location: [facility];  Service: Orthopedics;  Laterality: Left;   KNEE ARTHROPLASTY Right 11/12/2020   Procedure: COMPUTER ASSISTED TOTAL KNEE ARTHROPLASTY;  Surgeon: Mardee Lynwood SQUIBB, MD;  Location: [facility];  Service: Orthopedics;  Laterality: Right;   TOTAL HIP ARTHROPLASTY Left 02/2014    Allergies: Allergies as of 09/01/2023   (No Known Allergies)    Medications:  Current Outpatient Medications:    amLODipine  (NORVASC ) 10 MG tablet, Take 10 mg by mouth daily., Disp: , Rfl:    atorvastatin  (LIPITOR) 40 MG tablet, Take 40 mg by mouth daily., Disp: , Rfl:    celecoxib  (CELEBREX ) 200 MG capsule, Take 1 capsule (200 mg total) by mouth 2 (two) times daily., Disp: 90 capsule, Rfl: 0   lisinopril -hydrochlorothiazide  (ZESTORETIC ) 20-25 MG tablet, Take 2 tablets by mouth daily. , Disp: , Rfl:    enoxaparin  (LOVENOX ) 40 MG/0.4ML injection, Inject 0.4 mLs (40 mg  total) into the skin daily for 14 days., Disp: 5.6 mL, Rfl: 0   oxyCODONE  (OXY IR/ROXICODONE ) 5 MG immediate release tablet, Take 1 tablet (5 mg total) by mouth every 4 (four) hours as needed for severe pain (pain score 4-6)., Disp: 30 tablet, Rfl: 0   traMADol  (ULTRAM ) 50 MG tablet, Take 1 tablet (50 mg total) by mouth every 4 (four) hours as needed for moderate pain., Disp:  30 tablet, Rfl: 0  Social History: Social History   Tobacco Use   Smoking status: Some Days    Types: Cigars   Smokeless tobacco: Never   Tobacco comments:    Currently 3-4 cigars/day. (Prior 1 PPD for 30yrs. Quit in 1990)  Vaping Use   Vaping status: Never Used  Substance Use Topics   Alcohol use: Yes    Alcohol/week: 14.0 standard drinks of alcohol    Types: 14 Cans of beer per week   Drug use: Never    Family Medical History: History reviewed. No pertinent family history.  Physical Examination: Vitals:   09/01/23 1006  BP: (!) 142/90    General: Patient is in no apparent distress. Attention to examination is appropriate.  Neck:   Supple.  Full range of motion.  Respiratory: Patient is breathing without any difficulty.   NEUROLOGICAL:     Awake, alert, oriented to person, place, and time.  Speech is clear and fluent.   Cranial Nerves: Pupils equal round and reactive to light.  Facial tone is symmetric.  Facial sensation is symmetric. Shoulder shrug is symmetric. Tongue protrusion is midline.  There is no pronator drift.  Strength: Side Biceps Triceps Deltoid Interossei Grip Wrist Ext. Wrist Flex.  R 5 5 5 5 5 5 5   L 5 5 5 5 5 5 5    Side Iliopsoas Quads Hamstring PF DF EHL  R 5 5 5 5 5 5   L 5 5 5 5 5 5    Reflexes are 2+ and symmetric at the biceps, triceps, brachioradialis, patella and achilles.   Hoffman's is present.   Bilateral upper and lower extremity sensation is intact to light touch.    No evidence of dysmetria noted.  Gait is abnormal and wide-based.  He has severe difficulty with tandem walk..     Medical Decision Making  Imaging: MRI C spine 08/24/2023 Disc levels:   C2-3: Mild posterior bony ridging and mild-to-moderate facet arthropathy. The central canal is open. Mild to moderate foraminal narrowing is worse on the left.   C3-4: There is loss of disc space height with a broad-based bulge and ligamentum flavum thickening. Moderately  severe bilateral facet degenerative change is present. The ventral cord is markedly flattened and there is severe bilateral foraminal narrowing.   C4-5: Shallow disc bulge with uncovertebral spurring and mild-to-moderate facet arthropathy. The ventral thecal sac is effaced. There is severe right and mild-to-moderate left foraminal narrowing.   C5-6: Moderate to moderately severe bilateral facet degenerative change. Shallow disc bulge and uncovertebral spurring. There is also some ligamentum flavum thickening. Mild central canal stenosis is present. Moderate to moderately severe foraminal narrowing is seen.   C6-7: Bilateral facet arthropathy with a shallow disc bulge and some uncovertebral spurring. Ligamentum flavum thickening is seen. The central canal is open. Mild bilateral foraminal narrowing is greater on the right.   C7-T1: Bilateral facet degenerative change is seen and there is a shallow disc bulge without stenosis.   IMPRESSION: 1. 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 2. Severe right and mild-to-moderate left foraminal narrowing C4-5. 3. Mild central canal stenosis and moderate to moderately severe bilateral foraminal narrowing C5-6. 4. Mild bilateral foraminal narrowing C6-7 is worse on the right.     Electronically Signed   By: Debby Prader M.D.   On: 08/24/2023 10:21  I have personally reviewed the images and agree with the above interpretation.  Assessment and Plan: Mr. Mecham is a pleasant 73 y.o. male with cervical myelopathy and cervical radiculopathy.  He has severe cervical stenosis at C3-4 and right sided severe foraminal stenosis at C4-5.  I think these are both symptomatic.  There is no role for conservative management for cervical myelopathy.  I recommended surgical intervention with C3-5 anterior cervical discectomy and fusion.  He also has an  abnormality on his thoracic spine that is incompletely imaged on his lumbar MRI.  I have recommended a thoracic spine MRI for evaluation.   I discussed the planned procedure at length with the patient, including the risks, benefits, alternatives, and indications. The risks discussed include but are not limited to bleeding, infection, need for reoperation, spinal fluid leak, stroke, vision loss, anesthetic complication, coma, paralysis, and even death. We also discussed the possibility of post-operative dysphagia, vocal cord paralysis, and the risk of adjacent segment disease in the future. I also described in detail that improvement was not guaranteed.  The patient expressed understanding of these risks, and asked that we proceed with surgery. I described the surgery in layman's terms, and gave ample opportunity for questions, which were answered to the best of my ability.    I spent a total of 30 minutes in this patient's care today. This time was spent reviewing pertinent records including imaging studies, obtaining and confirming history, performing a directed evaluation, formulating and discussing my recommendations, and documenting the visit within the medical record.     Thank you for involving me in the care of this patient.      Lovell Roe K. Clois MD, [facility] Neurosurgery

## 2023-08-31 NOTE — Progress Notes (Unsigned)
 Referring Physician:  [facility] [address] [place],  [place] [address]  Primary Physician:  [facility]  History of Present Illness: 09/01/2023 [name] is here today with a chief complaint of difficulty with balance.  He has severe neck pain extending into his right shoulder.  He has been having problems for 4 months.  His balance has been getting worse.  He has tried some physical therapy without improvement in his neck pain.  He is also having some difficulty lifting his big toes.  His feet feel different than they previously did.  He has had some near falls. Bowel/Bladder Dysfunction: none  Conservative measures:  Physical therapy: is currently in PT at [facility] Multimodal medical therapy including regular antiinflammatories: Oxycodone , Tramadol , Celebrex   Injections: 08/12/2023: RFA to the bilateral L4-5 and L5-S1 facet joints 07/22/2023: MBB to the bilateral L4-5 and L5-S1 facet joints (7/10 to 1/10) 07/08/2023: MBB to the bilateral L4-5 and L5-S1 facet joints (7/10 to 1-2/10) 05/26/2023: Bilateral L4-5 transforaminal ESI (no relief, dexamethasone  12) 05/05/2023: Right C2-3 and C3-4 facet joint injections (50% relief Dr. Georgianna)   Past Surgery: none  [name] has symptoms of cervical myelopathy.  The symptoms are causing a significant impact on the patient's life.   I have utilized the care everywhere function in epic to review the outside records available from external health systems.  Review of Systems:  A 10 point review of systems is negative, except for the pertinent positives and negatives detailed in the HPI.  Past Medical History: Past Medical History:  Diagnosis Date   Coronary artery disease    stents x 2 placed in [place]  in approximately 2014-2015   ED (erectile dysfunction)    Hyperlipidemia    Hypertension    Hypogonadism in male    Osteoarthritis    RBBB (right bundle branch block)     Past Surgical  History: Past Surgical History:  Procedure Laterality Date   CARDIAC CATHETERIZATION  2015   stent   CORONARY ANGIOPLASTY     EXCISION MORTON'S NEUROMA Left 07/04/2019   Procedure: EXCISION MORTON'S NEUROMA LEFT TOE;  Surgeon: Ashley Soulier, DPM;  Location: [facility];  Service: Podiatry;  Laterality: Left;  LOCAL WITH IV   EXCISION MORTON'S NEUROMA Left 08/06/2020   Procedure: EXCISION MORTON'S NEUROMA- LEFT FT X2;  Surgeon: Ashley Soulier, DPM;  Location: [facility];  Service: Podiatry;  Laterality: Left;  GENERAL WITH LOCAL   KNEE ARTHROPLASTY Left 08/20/2019   Procedure: COMPUTER ASSISTED TOTAL KNEE ARTHROPLASTY;  Surgeon: Mardee Lynwood SQUIBB, MD;  Location: [facility];  Service: Orthopedics;  Laterality: Left;   KNEE ARTHROPLASTY Right 11/12/2020   Procedure: COMPUTER ASSISTED TOTAL KNEE ARTHROPLASTY;  Surgeon: Mardee Lynwood SQUIBB, MD;  Location: [facility];  Service: Orthopedics;  Laterality: Right;   TOTAL HIP ARTHROPLASTY Left 02/2014    Allergies: Allergies as of 09/01/2023   (No Known Allergies)    Medications:  Current Outpatient Medications:    amLODipine  (NORVASC ) 10 MG tablet, Take 10 mg by mouth daily., Disp: , Rfl:    atorvastatin  (LIPITOR) 40 MG tablet, Take 40 mg by mouth daily., Disp: , Rfl:    celecoxib  (CELEBREX ) 200 MG capsule, Take 1 capsule (200 mg total) by mouth 2 (two) times daily., Disp: 90 capsule, Rfl: 0   lisinopril -hydrochlorothiazide  (ZESTORETIC ) 20-25 MG tablet, Take 2 tablets by mouth daily. , Disp: , Rfl:    enoxaparin  (LOVENOX ) 40 MG/0.4ML injection, Inject 0.4 mLs (40 mg  total) into the skin daily for 14 days., Disp: 5.6 mL, Rfl: 0   oxyCODONE  (OXY IR/ROXICODONE ) 5 MG immediate release tablet, Take 1 tablet (5 mg total) by mouth every 4 (four) hours as needed for severe pain (pain score 4-6)., Disp: 30 tablet, Rfl: 0   traMADol  (ULTRAM ) 50 MG tablet, Take 1 tablet (50 mg total) by mouth every 4 (four) hours as needed for moderate pain., Disp:  30 tablet, Rfl: 0  Social History: Social History   Tobacco Use   Smoking status: Some Days    Types: Cigars   Smokeless tobacco: Never   Tobacco comments:    Currently 3-4 cigars/day. (Prior 1 PPD for 47yrs. Quit in 1990)  Vaping Use   Vaping status: Never Used  Substance Use Topics   Alcohol use: Yes    Alcohol/week: 14.0 standard drinks of alcohol    Types: 14 Cans of beer per week   Drug use: Never    Family Medical History: History reviewed. No pertinent family history.  Physical Examination: Vitals:   09/01/23 1006  BP: (!) 142/90    General: Patient is in no apparent distress. Attention to examination is appropriate.  Neck:   Supple.  Full range of motion.  Respiratory: Patient is breathing without any difficulty.   NEUROLOGICAL:     Awake, alert, oriented to person, place, and time.  Speech is clear and fluent.   Cranial Nerves: Pupils equal round and reactive to light.  Facial tone is symmetric.  Facial sensation is symmetric. Shoulder shrug is symmetric. Tongue protrusion is midline.  There is no pronator drift.  Strength: Side Biceps Triceps Deltoid Interossei Grip Wrist Ext. Wrist Flex.  R 5 5 5 5 5 5 5   L 5 5 5 5 5 5 5    Side Iliopsoas Quads Hamstring PF DF EHL  R 5 5 5 5 5 5   L 5 5 5 5 5 5    Reflexes are 2+ and symmetric at the biceps, triceps, brachioradialis, patella and achilles.   Hoffman's is present.   Bilateral upper and lower extremity sensation is intact to light touch.    No evidence of dysmetria noted.  Gait is abnormal and wide-based.  He has severe difficulty with tandem walk..     Medical Decision Making  Imaging: MRI C spine 08/24/2023 Disc levels:   C2-3: Mild posterior bony ridging and mild-to-moderate facet arthropathy. The central canal is open. Mild to moderate foraminal narrowing is worse on the left.   C3-4: There is loss of disc space height with a broad-based bulge and ligamentum flavum thickening. Moderately  severe bilateral facet degenerative change is present. The ventral cord is markedly flattened and there is severe bilateral foraminal narrowing.   C4-5: Shallow disc bulge with uncovertebral spurring and mild-to-moderate facet arthropathy. The ventral thecal sac is effaced. There is severe right and mild-to-moderate left foraminal narrowing.   C5-6: Moderate to moderately severe bilateral facet degenerative change. Shallow disc bulge and uncovertebral spurring. There is also some ligamentum flavum thickening. Mild central canal stenosis is present. Moderate to moderately severe foraminal narrowing is seen.   C6-7: Bilateral facet arthropathy with a shallow disc bulge and some uncovertebral spurring. Ligamentum flavum thickening is seen. The central canal is open. Mild bilateral foraminal narrowing is greater on the right.   C7-T1: Bilateral facet degenerative change is seen and there is a shallow disc bulge without stenosis.   IMPRESSION: 1. 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 2. Severe right and mild-to-moderate left foraminal narrowing C4-5. 3. Mild central canal stenosis and moderate to moderately severe bilateral foraminal narrowing C5-6. 4. Mild bilateral foraminal narrowing C6-7 is worse on the right.     Electronically Signed   By: Debby Prader M.D.   On: 08/24/2023 10:21  I have personally reviewed the images and agree with the above interpretation.  Assessment and Plan: Mr. Demirjian is a pleasant 73 y.o. male with cervical myelopathy and cervical radiculopathy.  He has severe cervical stenosis at C3-4 and right sided severe foraminal stenosis at C4-5.  I think these are both symptomatic.  There is no role for conservative management for cervical myelopathy.  I recommended surgical intervention with C3-5 anterior cervical discectomy and fusion.  He also has an  abnormality on his thoracic spine that is incompletely imaged on his lumbar MRI.  I have recommended a thoracic spine MRI for evaluation.   I discussed the planned procedure at length with the patient, including the risks, benefits, alternatives, and indications. The risks discussed include but are not limited to bleeding, infection, need for reoperation, spinal fluid leak, stroke, vision loss, anesthetic complication, coma, paralysis, and even death. We also discussed the possibility of post-operative dysphagia, vocal cord paralysis, and the risk of adjacent segment disease in the future. I also described in detail that improvement was not guaranteed.  The patient expressed understanding of these risks, and asked that we proceed with surgery. I described the surgery in layman's terms, and gave ample opportunity for questions, which were answered to the best of my ability.    I spent a total of 30 minutes in this patient's care today. This time was spent reviewing pertinent records including imaging studies, obtaining and confirming history, performing a directed evaluation, formulating and discussing my recommendations, and documenting the visit within the medical record.     Thank you for involving me in the care of this patient.      Loxley Schmale K. Clois MD, [facility] Neurosurgery

## 2023-09-01 ENCOUNTER — Encounter: Payer: Self-pay | Admitting: Neurosurgery

## 2023-09-01 ENCOUNTER — Other Ambulatory Visit: Payer: Self-pay

## 2023-09-01 ENCOUNTER — Ambulatory Visit: Admitting: Neurosurgery

## 2023-09-01 VITALS — BP 142/90 | Ht 72.0 in | Wt 259.0 lb

## 2023-09-01 DIAGNOSIS — G959 Disease of spinal cord, unspecified: Secondary | ICD-10-CM | POA: Diagnosis not present

## 2023-09-01 DIAGNOSIS — M4714 Other spondylosis with myelopathy, thoracic region: Secondary | ICD-10-CM

## 2023-09-01 DIAGNOSIS — M5412 Radiculopathy, cervical region: Secondary | ICD-10-CM

## 2023-09-01 DIAGNOSIS — Z01818 Encounter for other preprocedural examination: Secondary | ICD-10-CM

## 2023-09-01 DIAGNOSIS — M4802 Spinal stenosis, cervical region: Secondary | ICD-10-CM | POA: Diagnosis not present

## 2023-09-01 NOTE — Patient Instructions (Addendum)
 Please see below for information in regards to your upcoming surgery:   Planned surgery: C3-5 anterior cervical discectomy and fusion   Surgery date: 09/16/23 at [facility] (Medical Mall: [address] [place], [place] [address]) - you will find out your arrival time the business day before your surgery.   Pre-op appointment at [facility] Pre-admit Testing: you will receive a call with a date/time for this appointment. If you are scheduled for an in person appointment, Pre-admit Testing is located on the first floor of the Medical Arts building, [address], [address]. During this appointment, they will advise you which medications you can take the morning of surgery, and which medications you will need to hold for surgery. Labs (such as blood work, EKG) may be done at your pre-op appointment. You are not required to fast for these labs. Should you need to change your pre-op appointment, please call Pre-admit testing at [phone].     Surgical clearance: we will send a clearance form to Dr Cassondra Georgi. They may wish to see you in their office prior to signing the clearance form. If so, they may call you to schedule an appointment.     NSAIDS (Non-steroidal anti-inflammatory drugs): because you are having a fusion, please avoid taking any NSAIDS (examples: ibuprofen, motrin, aleve, naproxen, meloxicam, diclofenac) for 3 months after surgery. Celebrex  is an exception and is OK to take, if prescribed. Tylenol  is not an NSAID.    Common restrictions after surgery: No bending, lifting, or twisting ("BLT"). Avoid lifting objects heavier than 10 pounds for the first 6 weeks after surgery. Where possible, avoid household activities that involve lifting, bending, reaching, pushing, or pulling such as laundry, vacuuming, grocery shopping, and childcare. Try to arrange for help from friends and family for these activities while you heal. Do not drive  while taking prescription pain medication. Weeks 6 through 12 after surgery: avoid lifting more than 25 pounds.    X-rays after surgery: Because you are having a fusion: for appointments after your 2 week follow-up: please arrive at the [facility] outpatient imaging center ([address], [address], [place]) or [employer] [facility] one hour prior to your appointment for x-rays. This applies to every appointment after your 2 week follow-up. Failure to do so may result in your appointment being rescheduled.   How to contact us :  If you have any questions/concerns before or after surgery, you can reach us  at [phone], or you can send a mychart message. We can be reached by phone or mychart 8am-4pm, Monday-Friday.  *Please note: Calls after 4pm are forwarded to a third party answering service. Mychart messages are not routinely monitored during evenings, weekends, and holidays. Please call our office to contact the answering service for urgent concerns during non-business hours.   If you have FMLA/disability paperwork, please drop it off or fax it to [phone], attention Patty.   Appointments/FMLA & disability paperwork: Odetta Mora, & Ritta Registered Nurses/Surgery schedulers: Nekhi Liwanag & Lauren Medical Assistants: Damien ODESSIA Sailors Physician Assistants: Lyle Decamp, PA-C, Edsel Goods, PA-C & Glade Boys, PA-C Surgeons: Reeves Daisy, MD & Penne Sharps, MD   [facility] PREADMIT TESTING VISIT and SURGERY INFORMATION SHEET   Now that surgery has been scheduled you can anticipate several phone calls from [facility] services. A pharmacy technician will call you to verify your current list of medications taken at home.  The Pre-Service Center will call to verify your insurance information and to give you billing estimates and information.             The Preadmit Testing Office will be calling to schedule a visit to obtain  information for the anesthesia team and provide instructions on preparation for surgery.  What can you expect for the Preadmit Testing Visit: Appointments may be scheduled in-person or by telephone.  If a telephone visit is scheduled, you may be asked to come into the office to have lab tests or other studies performed.   This visit will not be completed any greater than 14 days prior to your surgery.  If your surgery has been scheduled for a future date, please do not be alarmed if we have not contacted you to schedule an appointment more than a month prior to the surgery date.    Please be prepared to provide the following information during this appointment:            -Personal medical history                                               -Medication and allergy list            -Any history of problems with anesthesia              -Recent lab work or diagnostic studies            -Please notify us  of any needs we should be aware of to provide the best care possible           -You will be provided with instructions on how to prepare for your surgery.    On The Day of Surgery:  You must have a driver to take you home after surgery, you will be asked not to drive for 24 hours following surgery.  Taxi, Gisele and non-medical transport will not be acceptable means of transportation unless you have a responsible individual who will be traveling with you.  Visitors in the surgical area:   2 people will be able to visit you in your room once your preparation for surgery has been completed. During surgery, your visitors will be asked to wait in the Surgery Waiting Area.  It is not a requirement for them to stay, if they prefer to leave and come back.  Your visitor(s) will be given an update once the surgery has been completed.  No visitors are allowed in the initial recovery room to respect patient privacy and safety.  Once you are more awake and transfer to the secondary recovery area, or are  transferred to an inpatient room, visitors will again be able to see you.  To respect and protect your privacy: We will ask on the day of surgery who your driver will be and what the contact number for that individual will be. We will ask if it is okay to share information with this individual, or if there is an alternative individual that we, or the surgeon, should contact to provide updates and information. If family or friends come to the surgical information desk requesting information about you, who you have not listed with us , no information will be given.   It may be helpful to designate someone as the main contact who will be responsible for updating your other friends  and family.    PREADMIT TESTING OFFICE: [phone] SAME DAY SURGERY: [phone] We look forward to caring for you before and throughout the process of your surgery.

## 2023-09-05 ENCOUNTER — Other Ambulatory Visit: Payer: Self-pay

## 2023-09-05 ENCOUNTER — Encounter: Payer: Self-pay | Admitting: Neurosurgery

## 2023-09-05 ENCOUNTER — Encounter
Admission: RE | Admit: 2023-09-05 | Discharge: 2023-09-05 | Disposition: A | Discharge status: Home / Self Care | Source: Ambulatory Visit | Attending: Neurosurgery | Admitting: Neurosurgery

## 2023-09-05 VITALS — BP 130/80 | HR 96 | Resp 16 | Wt 259.7 lb

## 2023-09-05 DIAGNOSIS — Z955 Presence of coronary angioplasty implant and graft: Secondary | ICD-10-CM | POA: Insufficient documentation

## 2023-09-05 DIAGNOSIS — I10 Essential (primary) hypertension: Secondary | ICD-10-CM | POA: Diagnosis not present

## 2023-09-05 DIAGNOSIS — Z01818 Encounter for other preprocedural examination: Secondary | ICD-10-CM | POA: Diagnosis not present

## 2023-09-05 DIAGNOSIS — I452 Bifascicular block: Secondary | ICD-10-CM | POA: Diagnosis not present

## 2023-09-05 DIAGNOSIS — Z01812 Encounter for preprocedural laboratory examination: Secondary | ICD-10-CM

## 2023-09-05 DIAGNOSIS — Z01810 Encounter for preprocedural cardiovascular examination: Secondary | ICD-10-CM | POA: Diagnosis not present

## 2023-09-05 DIAGNOSIS — I2510 Atherosclerotic heart disease of native coronary artery without angina pectoris: Secondary | ICD-10-CM | POA: Diagnosis not present

## 2023-09-05 HISTORY — DX: Disease of spinal cord, unspecified: G95.9

## 2023-09-05 HISTORY — DX: Stricture and stenosis of cervix uteri: N88.2

## 2023-09-05 HISTORY — DX: Radiculopathy, cervical region: M54.12

## 2023-09-05 LAB — TYPE AND SCREEN
ABO/RH(D): B POS
Antibody Screen: NEGATIVE

## 2023-09-05 LAB — CBC
HCT: 41.4 % (ref 39.0–52.0)
Hemoglobin: 14.3 g/dL (ref 13.0–17.0)
MCH: 30.1 pg (ref 26.0–34.0)
MCHC: 34.5 g/dL (ref 30.0–36.0)
MCV: 87.2 fL (ref 80.0–100.0)
Platelets: 154 10*3/uL (ref 150–400)
RBC: 4.75 MIL/uL (ref 4.22–5.81)
RDW: 13.1 % (ref 11.5–15.5)
WBC: 6.3 10*3/uL (ref 4.0–10.5)
nRBC: 0 % (ref 0.0–0.2)

## 2023-09-05 LAB — BASIC METABOLIC PANEL WITH GFR
Anion gap: 11 (ref 5–15)
BUN: 22 mg/dL (ref 8–23)
CO2: 26 mmol/L (ref 22–32)
Calcium: 9.1 mg/dL (ref 8.9–10.3)
Chloride: 103 mmol/L (ref 98–111)
Creatinine, Ser: 1.09 mg/dL (ref 0.61–1.24)
GFR, Estimated: >60 mL/min (ref >60)
Glucose, Bld: 141 mg/dL — ABNORMAL HIGH (ref 70–99)
Potassium: 3.6 mmol/L (ref 3.5–5.1)
Sodium: 140 mmol/L (ref 135–145)

## 2023-09-05 LAB — SURGICAL PCR SCREEN
MRSA, PCR: NEGATIVE
Staphylococcus aureus: NEGATIVE

## 2023-09-05 NOTE — Patient Instructions (Addendum)
 Your procedure is scheduled on: 09/16/23 - Friday Report to the Registration Desk on the 1st floor of the Medical Mall. To find out your arrival time, please call [phone] between 1PM - 3PM on: 09/15/23 - Thursday If your arrival time is 6:00 am, do not arrive before that time as the Medical Mall entrance doors do not open until 6:00 am.  REMEMBER: Instructions that are not followed completely may result in serious medical risk, up to and including death; or upon the discretion of your surgeon and anesthesiologist your surgery may need to be rescheduled.  Do not eat food after midnight the night before surgery.  No gum chewing or hard candies.  You may however, drink CLEAR liquids up to 2 hours before you are scheduled to arrive for your surgery. Do not drink anything within 2 hours of your scheduled arrival time.  Clear liquids include: - water   - apple juice without pulp - gatorade (not RED colors) - black coffee or tea (Do NOT add milk or creamers to the coffee or tea) Do NOT drink anything that is not on this list.   NSAIDS (Non-steroidal anti-inflammatory drugs): because you are having a fusion, please avoid taking any NSAIDS (examples: ibuprofen, motrin, aleve, naproxen, meloxicam, diclofenac) for 3 months after surgery. Celebrex  is an exception and is OK to take, if prescribed. Tylenol  is not an NSAID.   Stop BEGINNING 07/04, ANY OVER THE COUNTER supplements until after surgery.  HOLD lisinopril -hydrochlorothiazide  on the morning of surgery.   ON THE DAY OF SURGERY ONLY TAKE THESE MEDICATIONS WITH SIPS OF WATER :  amLODipine  (NORVASC )  atorvastatin  (LIPITOR)    No Alcohol for 24 hours before or after surgery.  No Smoking including e-cigarettes for 24 hours before surgery.  No chewable tobacco products for at least 6 hours before surgery.  No nicotine patches on the day of surgery.  Do not use any recreational drugs for at least a week (preferably 2 weeks) before  your surgery.  Please be advised that the combination of cocaine and anesthesia may have negative outcomes, up to and including death. If you test positive for cocaine, your surgery will be cancelled.  On the morning of surgery brush your teeth with toothpaste and water , you may rinse your mouth with mouthwash if you wish. Do not swallow any toothpaste or mouthwash.  Use CHG Soap or wipes as directed on instruction sheet.  Do not wear jewelry, make-up, hairpins, clips or nail polish.  For welded (permanent) jewelry: bracelets, anklets, waist bands, etc.  Please have this removed prior to surgery.  If it is not removed, there is a chance that hospital personnel will need to cut it off on the day of surgery.  Do not wear lotions, powders, or perfumes.   Do not shave body hair from the neck down 48 hours before surgery.  Contact lenses, hearing aids and dentures may not be worn into surgery.  Do not bring valuables to the hospital. [facility] is not responsible for any missing/lost belongings or valuables.   Notify your doctor if there is any change in your medical condition (cold, fever, infection).  Wear comfortable clothing (specific to your surgery type) to the hospital.  After surgery, you can help prevent lung complications by doing breathing exercises.  Take deep breaths and cough every 1-2 hours. Your doctor may order a device called an Incentive Spirometer to help you take deep breaths.  When coughing or sneezing, hold a pillow firmly against your incision  with both hands. This is called "splinting." Doing this helps protect your incision. It also decreases belly discomfort.  If you are being admitted to the hospital overnight, leave your suitcase in the car. After surgery it may be brought to your room.  In case of increased patient census, it may be necessary for you, the patient, to continue your postoperative care in the Same Day Surgery department.  If you are being  discharged the day of surgery, you will not be allowed to drive home. You will need a responsible individual to drive you home and stay with you for 24 hours after surgery.   If you are taking public transportation, you will need to have a responsible individual with you.  Please call the Pre-admissions Testing Dept. at [phone] if you have any questions about these instructions.  Surgery Visitation Policy:  Patients having surgery or a procedure may have two visitors.  Children under the age of 66 must have an adult with them who is not the patient.  Inpatient Visitation:    Visiting hours are 7 a.m. to 8 p.m. Up to four visitors are allowed at one time in a patient room. The visitors may rotate out with other people during the day.  One visitor age 42 or older may stay with the patient overnight and must be in the room by 8 p.m.   Merchandiser, retail to address health-related social needs:  [email or web address]    Pre-operative 5 CHG Bath Instructions   You can play a key role in reducing the risk of infection after surgery. Your skin needs to be as free of germs as possible. You can reduce the number of germs on your skin by washing with CHG (chlorhexidine  gluconate) soap before surgery. CHG is an antiseptic soap that kills germs and continues to kill germs even after washing.   DO NOT use if you have an allergy to chlorhexidine /CHG or antibacterial soaps. If your skin becomes reddened or irritated, stop using the CHG and notify one of our RNs at [phone].   Please shower with the CHG soap starting 4 days before surgery using the following schedule: TAKE YOUR 1ST SHOWER ON 07/07 , SHOWER EVERY DAY WITH YOUR LAST SHOWER BEING ON THE MORNING OF SURGERY ON  07/11.    Please keep in mind the following:  DO NOT shave, including legs and underarms, starting the day of your first shower.   You may shave your face at any point before/day of surgery.   Place clean sheets on your bed the day you start using CHG soap. Use a clean washcloth (not used since being washed) for each shower. DO NOT sleep with pets once you start using the CHG.   CHG Shower Instructions:  If you choose to wash your hair and private area, wash first with your normal shampoo/soap.  After you use shampoo/soap, rinse your hair and body thoroughly to remove shampoo/soap residue.  Turn the water  OFF and apply about 3 tablespoons (45 ml) of CHG soap to a CLEAN washcloth.  Apply CHG soap ONLY FROM YOUR NECK DOWN TO YOUR TOES (washing for 3-5 minutes)  DO NOT use CHG soap on face, private areas, open wounds, or sores.  Pay special attention to the area where your surgery is being performed.  If you are having back surgery, having someone wash your back for you may be helpful. Wait 2 minutes after CHG soap is applied, then you may rinse off the CHG soap.  Pat dry with a clean towel  Put on clean clothes/pajamas   If you choose to wear lotion, please use ONLY the CHG-compatible lotions on the back of this paper.     Additional instructions for the day of surgery: DO NOT APPLY any lotions, deodorants, cologne, or perfumes.   Put on clean/comfortable clothes.  Brush your teeth.  Ask your nurse before applying any prescription medications to the skin.      CHG Compatible Lotions   Aveeno Moisturizing lotion  Cetaphil Moisturizing Cream  Cetaphil Moisturizing Lotion  Clairol Herbal Essence Moisturizing Lotion, Dry Skin  Clairol Herbal Essence Moisturizing Lotion, Extra Dry Skin  Clairol Herbal Essence Moisturizing Lotion, Normal Skin  Curel Age Defying Therapeutic Moisturizing Lotion with Alpha Hydroxy  Curel Extreme Care Body Lotion  Curel Soothing Hands Moisturizing Hand Lotion  Curel Therapeutic Moisturizing Cream, Fragrance-Free  Curel Therapeutic Moisturizing Lotion, Fragrance-Free  Curel Therapeutic Moisturizing Lotion, Original Formula  Eucerin Daily  Replenishing Lotion  Eucerin Dry Skin Therapy Plus Alpha Hydroxy Crme  Eucerin Dry Skin Therapy Plus Alpha Hydroxy Lotion  Eucerin Original Crme  Eucerin Original Lotion  Eucerin Plus Crme Eucerin Plus Lotion  Eucerin TriLipid Replenishing Lotion  Keri Anti-Bacterial Hand Lotion  Keri Deep Conditioning Original Lotion Dry Skin Formula Softly Scented  Keri Deep Conditioning Original Lotion, Fragrance Free Sensitive Skin Formula  Keri Lotion Fast Absorbing Fragrance Free Sensitive Skin Formula  Keri Lotion Fast Absorbing Softly Scented Dry Skin Formula  Keri Original Lotion  Keri Skin Renewal Lotion Keri Silky Smooth Lotion  Keri Silky Smooth Sensitive Skin Lotion  Nivea Body Creamy Conditioning Oil  Nivea Body Extra Enriched Teacher, adult education Moisturizing Lotion Nivea Crme  Nivea Skin Firming Lotion  NutraDerm 30 Skin Lotion  NutraDerm Skin Lotion  NutraDerm Therapeutic Skin Cream  NutraDerm Therapeutic Skin Lotion  ProShield Protective Hand Cream  Provon moisturizing lotion

## 2023-09-06 ENCOUNTER — Telehealth: Payer: Self-pay

## 2023-09-06 NOTE — Telephone Encounter (Signed)
 Dr Clois has requested PCP clearance for surgery. Spoke with Arland, nurse at Dr Candice office. Mr Heavin must be seen before they will clear him. They have scheduled him an appointment for 7/2 at 10:30am, arrive at 10:15am. I have notified Mr [facility] of this appointment and explained that they are requiring him to come in because he hasn't been seen since February. He stated he did not know if he could make this appointment and they probably just want his money and he did not understand why it was necessary when he has seen other providers over the past several months. I explained that the other providers (such as Whitney and Dr Avanell in [place]) do not manage his chronic medical conditions, and that it is standard practice for us  to request clearance from primary care providers surgery when you have chronic medical conditions. I advised him to contact Dr Trinidad office to reschedule the appointment to another date/time ASAP if he cannot make the appointment tomorrow, but that this will be needed for surgery, or we will need to reschedule his surgery.

## 2023-09-07 ENCOUNTER — Ambulatory Visit
Admission: RE | Admit: 2023-09-07 | Discharge: 2023-09-07 | Disposition: A | Discharge status: Home / Self Care | Source: Ambulatory Visit | Attending: Neurosurgery | Admitting: Neurosurgery

## 2023-09-07 DIAGNOSIS — M4714 Other spondylosis with myelopathy, thoracic region: Secondary | ICD-10-CM

## 2023-09-08 NOTE — Telephone Encounter (Signed)
 Clearance received.

## 2023-09-12 ENCOUNTER — Encounter: Payer: Self-pay | Admitting: Neurosurgery

## 2023-09-16 ENCOUNTER — Encounter
Admission: RE | Disposition: A | Discharge status: Home / Self Care | Payer: Self-pay | Source: Home / Self Care | Attending: Neurosurgery

## 2023-09-16 ENCOUNTER — Observation Stay
Admission: RE | Admit: 2023-09-16 | Discharge: 2023-09-17 | Disposition: A | Discharge status: Home / Self Care | Attending: Neurosurgery | Admitting: Neurosurgery

## 2023-09-16 ENCOUNTER — Encounter: Payer: Self-pay | Admitting: Neurosurgery

## 2023-09-16 ENCOUNTER — Other Ambulatory Visit: Payer: Self-pay

## 2023-09-16 ENCOUNTER — Ambulatory Visit

## 2023-09-16 ENCOUNTER — Ambulatory Visit: Payer: Self-pay | Admitting: Urgent Care

## 2023-09-16 DIAGNOSIS — M4802 Spinal stenosis, cervical region: Secondary | ICD-10-CM | POA: Diagnosis not present

## 2023-09-16 DIAGNOSIS — I10 Essential (primary) hypertension: Secondary | ICD-10-CM | POA: Diagnosis not present

## 2023-09-16 DIAGNOSIS — F17210 Nicotine dependence, cigarettes, uncomplicated: Secondary | ICD-10-CM | POA: Diagnosis not present

## 2023-09-16 DIAGNOSIS — G959 Disease of spinal cord, unspecified: Secondary | ICD-10-CM | POA: Diagnosis not present

## 2023-09-16 DIAGNOSIS — M5412 Radiculopathy, cervical region: Secondary | ICD-10-CM

## 2023-09-16 DIAGNOSIS — I2510 Atherosclerotic heart disease of native coronary artery without angina pectoris: Secondary | ICD-10-CM | POA: Insufficient documentation

## 2023-09-16 DIAGNOSIS — F1090 Alcohol use, unspecified, uncomplicated: Secondary | ICD-10-CM | POA: Diagnosis not present

## 2023-09-16 DIAGNOSIS — Z01818 Encounter for other preprocedural examination: Secondary | ICD-10-CM

## 2023-09-16 HISTORY — PX: ANTERIOR CERVICAL DECOMP/DISCECTOMY FUSION: SHX1161

## 2023-09-16 HISTORY — DX: Bifascicular block: I45.2

## 2023-09-16 SURGERY — ANTERIOR CERVICAL DECOMPRESSION/DISCECTOMY FUSION 2 LEVELS
Anesthesia: General

## 2023-09-16 MED ORDER — FENTANYL CITRATE (PF) 100 MCG/2ML IJ SOLN
INTRAMUSCULAR | Status: AC
Start: 2023-09-16 — End: 2023-09-16
  Filled 2023-09-16: qty 2

## 2023-09-16 MED ORDER — CEFAZOLIN SODIUM-DEXTROSE 2-4 GM/100ML-% IV SOLN
INTRAVENOUS | Status: AC
Start: 2023-09-16 — End: 2023-09-16
  Filled 2023-09-16: qty 100

## 2023-09-16 MED ORDER — SODIUM CHLORIDE 0.9 % IV SOLN: 250.0000 mL | INTRAVENOUS | Status: DC

## 2023-09-16 MED ORDER — CHLORHEXIDINE GLUCONATE 0.12 % MT SOLN
OROMUCOSAL | Status: AC
Start: 2023-09-16 — End: 2023-09-16
  Filled 2023-09-16: qty 15

## 2023-09-16 MED ORDER — POTASSIUM CHLORIDE IN NACL 20-0.9 MEQ/L-% IV SOLN
INTRAVENOUS | Status: DC
  Filled 2023-09-16: qty 1000

## 2023-09-16 MED ORDER — LIDOCAINE HCL (CARDIAC) PF 100 MG/5ML IV SOSY
PREFILLED_SYRINGE | INTRAVENOUS | Status: DC | PRN
Start: 2023-09-16 — End: 2023-09-16
  Administered 2023-09-16: 100 mg via INTRAVENOUS

## 2023-09-16 MED ORDER — SENNA 8.6 MG PO TABS
1.0000 | ORAL_TABLET | Freq: Two times a day (BID) | ORAL | Status: DC
  Administered 2023-09-16 – 2023-09-17 (×2): 8.6 mg via ORAL
  Filled 2023-09-16 (×2): qty 1

## 2023-09-16 MED ORDER — FENTANYL CITRATE (PF) 100 MCG/2ML IJ SOLN
INTRAMUSCULAR | Status: DC | PRN
  Administered 2023-09-16 (×2): 50 ug via INTRAVENOUS

## 2023-09-16 MED ORDER — FENTANYL CITRATE (PF) 100 MCG/2ML IJ SOLN
25.0000 ug | INTRAMUSCULAR | Status: AC | PRN
  Administered 2023-09-16: 50 ug via INTRAVENOUS
  Administered 2023-09-16: 25 ug via INTRAVENOUS
  Administered 2023-09-16: 50 ug via INTRAVENOUS
  Administered 2023-09-16 (×3): 25 ug via INTRAVENOUS

## 2023-09-16 MED ORDER — SODIUM CHLORIDE 0.9 % IV SOLN
INTRAVENOUS | Status: DC | PRN
  Administered 2023-09-16: .1 ug/kg/min via INTRAVENOUS

## 2023-09-16 MED ORDER — OXYCODONE HCL 5 MG PO TABS
5.0000 mg | ORAL_TABLET | ORAL | 0 refills | Status: DC | PRN
Start: 2023-09-16 — End: 2023-09-20

## 2023-09-16 MED ORDER — CHLORHEXIDINE GLUCONATE 0.12 % MT SOLN
15.0000 mL | Freq: Once | OROMUCOSAL | Status: AC
  Administered 2023-09-16: 15 mL via OROMUCOSAL

## 2023-09-16 MED ORDER — PROPOFOL 1000 MG/100ML IV EMUL
INTRAVENOUS | Status: AC
  Filled 2023-09-16: qty 100

## 2023-09-16 MED ORDER — REMIFENTANIL HCL 1 MG IV SOLR
INTRAVENOUS | Status: AC
  Filled 2023-09-16: qty 1000

## 2023-09-16 MED ORDER — CEFAZOLIN SODIUM-DEXTROSE 2-4 GM/100ML-% IV SOLN
2.0000 g | Freq: Once | INTRAVENOUS | Status: AC
  Administered 2023-09-16: 2 g via INTRAVENOUS

## 2023-09-16 MED ORDER — FENTANYL CITRATE (PF) 100 MCG/2ML IJ SOLN
INTRAMUSCULAR | Status: AC
  Filled 2023-09-16: qty 2

## 2023-09-16 MED ORDER — ONDANSETRON HCL 4 MG PO TABS: 4.0000 mg | ORAL_TABLET | Freq: Four times a day (QID) | ORAL | Status: DC | PRN

## 2023-09-16 MED ORDER — OXYCODONE HCL 5 MG PO TABS
5.0000 mg | ORAL_TABLET | Freq: Once | ORAL | Status: AC | PRN
  Administered 2023-09-16: 5 mg via ORAL

## 2023-09-16 MED ORDER — SODIUM CHLORIDE 0.9% FLUSH: 3.0000 mL | INTRAVENOUS | Status: DC | PRN

## 2023-09-16 MED ORDER — POLYETHYLENE GLYCOL 3350 17 G PO PACK: 17.0000 g | PACK | Freq: Every day | ORAL | Status: DC | PRN

## 2023-09-16 MED ORDER — OXYCODONE HCL 5 MG PO TABS
ORAL_TABLET | ORAL | Status: AC
  Filled 2023-09-16: qty 1

## 2023-09-16 MED ORDER — PHENYLEPHRINE HCL-NACL 20-0.9 MG/250ML-% IV SOLN
INTRAVENOUS | Status: DC | PRN
Start: 2023-09-16 — End: 2023-09-16
  Administered 2023-09-16: 50 ug/min via INTRAVENOUS

## 2023-09-16 MED ORDER — LACTATED RINGERS IV SOLN: INTRAVENOUS | Status: DC

## 2023-09-16 MED ORDER — PHENOL 1.4 % MT LIQD: 1.0000 | OROMUCOSAL | Status: DC | PRN

## 2023-09-16 MED ORDER — HYDROCHLOROTHIAZIDE 25 MG PO TABS
25.0000 mg | ORAL_TABLET | Freq: Every day | ORAL | Status: DC
  Administered 2023-09-16 – 2023-09-17 (×2): 25 mg via ORAL
  Filled 2023-09-16 (×2): qty 1

## 2023-09-16 MED ORDER — METHOCARBAMOL 1000 MG/10ML IJ SOLN: 500.0000 mg | Freq: Four times a day (QID) | INTRAMUSCULAR | Status: DC | PRN

## 2023-09-16 MED ORDER — GLYCOPYRROLATE 0.2 MG/ML IJ SOLN
INTRAMUSCULAR | Status: DC | PRN
  Administered 2023-09-16: .2 mg via INTRAVENOUS

## 2023-09-16 MED ORDER — LIDOCAINE HCL (PF) 2 % IJ SOLN
INTRAMUSCULAR | Status: AC
  Filled 2023-09-16: qty 5

## 2023-09-16 MED ORDER — GABAPENTIN 300 MG PO CAPS
300.0000 mg | ORAL_CAPSULE | Freq: Three times a day (TID) | ORAL | Status: DC
Start: 2023-09-16 — End: 2023-09-17
  Administered 2023-09-16 – 2023-09-17 (×3): 300 mg via ORAL
  Filled 2023-09-16 (×3): qty 1

## 2023-09-16 MED ORDER — CELECOXIB 200 MG PO CAPS
200.0000 mg | ORAL_CAPSULE | Freq: Two times a day (BID) | ORAL | Status: DC
Start: 2023-09-16 — End: 2023-09-17
  Administered 2023-09-16 – 2023-09-17 (×2): 200 mg via ORAL
  Filled 2023-09-16 (×2): qty 1

## 2023-09-16 MED ORDER — BUPIVACAINE-EPINEPHRINE (PF) 0.5% -1:200000 IJ SOLN
INTRAMUSCULAR | Status: DC | PRN
  Administered 2023-09-16: 9 mL via PERINEURAL

## 2023-09-16 MED ORDER — DEXAMETHASONE SODIUM PHOSPHATE 10 MG/ML IJ SOLN
INTRAMUSCULAR | Status: DC | PRN
  Administered 2023-09-16: 10 mg via INTRAVENOUS

## 2023-09-16 MED ORDER — KETAMINE HCL 50 MG/5ML IJ SOSY
PREFILLED_SYRINGE | INTRAMUSCULAR | Status: AC
  Filled 2023-09-16: qty 5

## 2023-09-16 MED ORDER — LISINOPRIL 20 MG PO TABS
40.0000 mg | ORAL_TABLET | Freq: Every day | ORAL | Status: DC
  Administered 2023-09-16 – 2023-09-17 (×2): 40 mg via ORAL
  Filled 2023-09-16 (×2): qty 2

## 2023-09-16 MED ORDER — METHOCARBAMOL 500 MG PO TABS
500.0000 mg | ORAL_TABLET | Freq: Four times a day (QID) | ORAL | Status: DC | PRN
Start: 2023-09-16 — End: 2023-09-17
  Administered 2023-09-16 – 2023-09-17 (×3): 500 mg via ORAL
  Filled 2023-09-16 (×3): qty 1

## 2023-09-16 MED ORDER — ONDANSETRON HCL 4 MG/2ML IJ SOLN
INTRAMUSCULAR | Status: DC | PRN
  Administered 2023-09-16: 4 mg via INTRAVENOUS

## 2023-09-16 MED ORDER — ATORVASTATIN CALCIUM 20 MG PO TABS
40.0000 mg | ORAL_TABLET | Freq: Every day | ORAL | Status: DC
  Administered 2023-09-17: 40 mg via ORAL
  Filled 2023-09-16 (×2): qty 2

## 2023-09-16 MED ORDER — ONDANSETRON HCL 4 MG/2ML IJ SOLN
INTRAMUSCULAR | Status: AC
  Filled 2023-09-16: qty 2

## 2023-09-16 MED ORDER — PROPOFOL 1000 MG/100ML IV EMUL
INTRAVENOUS | Status: AC
Start: 2023-09-16 — End: 2023-09-16
  Filled 2023-09-16: qty 100

## 2023-09-16 MED ORDER — HYDROCHLOROTHIAZIDE 50 MG PO TABS
50.0000 mg | ORAL_TABLET | Freq: Every day | ORAL | Status: DC
  Filled 2023-09-16: qty 1

## 2023-09-16 MED ORDER — LISINOPRIL 20 MG PO TABS: 40.0000 mg | ORAL_TABLET | Freq: Every day | ORAL | Status: DC

## 2023-09-16 MED ORDER — LISINOPRIL-HYDROCHLOROTHIAZIDE 20-25 MG PO TABS: 2.0000 | ORAL_TABLET | Freq: Every day | ORAL | Status: DC

## 2023-09-16 MED ORDER — ATROPINE SULFATE 1 MG/10ML IJ SOSY
PREFILLED_SYRINGE | INTRAMUSCULAR | Status: DC | PRN
  Administered 2023-09-16: 1 mg via INTRAVENOUS

## 2023-09-16 MED ORDER — METHOCARBAMOL 500 MG PO TABS: 500.0000 mg | ORAL_TABLET | Freq: Four times a day (QID) | ORAL | 0 refills | Status: DC | PRN

## 2023-09-16 MED ORDER — OXYCODONE HCL 5 MG PO TABS
5.0000 mg | ORAL_TABLET | ORAL | Status: DC | PRN
  Administered 2023-09-16: 5 mg via ORAL
  Filled 2023-09-16: qty 1

## 2023-09-16 MED ORDER — ACETAMINOPHEN 650 MG RE SUPP: 650.0000 mg | RECTAL | Status: DC | PRN

## 2023-09-16 MED ORDER — EPHEDRINE SULFATE-NACL 50-0.9 MG/10ML-% IV SOSY
PREFILLED_SYRINGE | INTRAVENOUS | Status: DC | PRN
  Administered 2023-09-16: 10 mg via INTRAVENOUS
  Administered 2023-09-16: 15 mg via INTRAVENOUS

## 2023-09-16 MED ORDER — ORAL CARE MOUTH RINSE
15.0000 mL | Freq: Once | OROMUCOSAL | Status: AC
Start: 2023-09-16 — End: 2023-09-16

## 2023-09-16 MED ORDER — ACETAMINOPHEN 325 MG PO TABS: 650.0000 mg | ORAL_TABLET | ORAL | Status: DC | PRN

## 2023-09-16 MED ORDER — SODIUM CHLORIDE 0.9% FLUSH
3.0000 mL | Freq: Two times a day (BID) | INTRAVENOUS | Status: DC
Start: 2023-09-16 — End: 2023-09-17
  Administered 2023-09-16 – 2023-09-17 (×2): 3 mL via INTRAVENOUS

## 2023-09-16 MED ORDER — MENTHOL 3 MG MT LOZG: 1.0000 | LOZENGE | OROMUCOSAL | Status: DC | PRN

## 2023-09-16 MED ORDER — ONDANSETRON HCL 4 MG/2ML IJ SOLN: 4.0000 mg | Freq: Four times a day (QID) | INTRAMUSCULAR | Status: DC | PRN

## 2023-09-16 MED ORDER — SUCCINYLCHOLINE CHLORIDE 200 MG/10ML IV SOSY
PREFILLED_SYRINGE | INTRAVENOUS | Status: AC
  Filled 2023-09-16: qty 10

## 2023-09-16 MED ORDER — OXYCODONE HCL 5 MG PO TABS
10.0000 mg | ORAL_TABLET | ORAL | Status: DC | PRN
  Administered 2023-09-16 – 2023-09-17 (×3): 10 mg via ORAL
  Filled 2023-09-16 (×3): qty 2

## 2023-09-16 MED ORDER — SUCCINYLCHOLINE CHLORIDE 200 MG/10ML IV SOSY
PREFILLED_SYRINGE | INTRAVENOUS | Status: DC | PRN
  Administered 2023-09-16: 120 mg via INTRAVENOUS

## 2023-09-16 MED ORDER — PROPOFOL 10 MG/ML IV BOLUS
INTRAVENOUS | Status: DC | PRN
Start: 2023-09-16 — End: 2023-09-16
  Administered 2023-09-16: 120 ug/kg/min via INTRAVENOUS
  Administered 2023-09-16: 150 ug/kg/min via INTRAVENOUS

## 2023-09-16 MED ORDER — SURGIFLO WITH THROMBIN (HEMOSTATIC MATRIX KIT) OPTIME
TOPICAL | Status: DC | PRN
  Administered 2023-09-16: 1 via TOPICAL

## 2023-09-16 MED ORDER — DEXAMETHASONE SODIUM PHOSPHATE 10 MG/ML IJ SOLN
INTRAMUSCULAR | Status: AC
  Filled 2023-09-16: qty 1

## 2023-09-16 MED ORDER — OXYCODONE HCL 5 MG/5ML PO SOLN: 5.0000 mg | Freq: Once | ORAL | Status: AC | PRN

## 2023-09-16 MED ORDER — DEXMEDETOMIDINE HCL IN NACL 80 MCG/20ML IV SOLN
INTRAVENOUS | Status: DC | PRN
  Administered 2023-09-16: 8 ug via INTRAVENOUS

## 2023-09-16 MED ORDER — KETAMINE HCL 50 MG/5ML IJ SOSY
PREFILLED_SYRINGE | INTRAMUSCULAR | Status: DC | PRN
  Administered 2023-09-16: 20 mg via INTRAVENOUS

## 2023-09-16 MED ORDER — 0.9 % SODIUM CHLORIDE (POUR BTL) OPTIME
TOPICAL | Status: DC | PRN
  Administered 2023-09-16: 359 mL

## 2023-09-16 MED ORDER — AMLODIPINE BESYLATE 10 MG PO TABS
10.0000 mg | ORAL_TABLET | Freq: Every day | ORAL | Status: DC
  Administered 2023-09-17: 10 mg via ORAL
  Filled 2023-09-16: qty 1

## 2023-09-16 MED ORDER — SENNA 8.6 MG PO TABS: 1.0000 | ORAL_TABLET | Freq: Two times a day (BID) | ORAL | 0 refills | Status: DC

## 2023-09-16 MED ORDER — PROPOFOL 10 MG/ML IV BOLUS
INTRAVENOUS | Status: AC
  Filled 2023-09-16: qty 20

## 2023-09-16 SURGICAL SUPPLY — 36 items
BASIN KIT SINGLE STR (MISCELLANEOUS) ×1 IMPLANT
BUR NEURO DRILL SOFT 3.0X3.8M (BURR) ×1 IMPLANT
DERMABOND ADVANCED .7 DNX12 (GAUZE/BANDAGES/DRESSINGS) ×1 IMPLANT
DRAIN CHANNEL JP 10F RND 20C F (MISCELLANEOUS) IMPLANT
DRAPE C ARM PK CFD 31 SPINE (DRAPES) ×1 IMPLANT
DRAPE LAPAROTOMY 77X122 PED (DRAPES) ×1 IMPLANT
DRAPE MICROSCOPE SPINE 48X150 (DRAPES) ×1 IMPLANT
DRSG TEGADERM 4X4.75 (GAUZE/BANDAGES/DRESSINGS) IMPLANT
ELECTRODE REM PT RTRN 9FT ADLT (ELECTROSURGICAL) ×1 IMPLANT
EVACUATOR SILICONE 100CC (DRAIN) IMPLANT
FEE INTRAOP CADWELL SUPPLY NCS (MISCELLANEOUS) IMPLANT
FEE INTRAOP MONITOR IMPULS NCS (MISCELLANEOUS) IMPLANT
GAUZE SPONGE 2X2 STRL 8-PLY (GAUZE/BANDAGES/DRESSINGS) IMPLANT
GLOVE BIOGEL PI IND STRL 6.5 (GLOVE) ×1 IMPLANT
GLOVE SURG SYN 6.5 PF PI (GLOVE) ×1 IMPLANT
GLOVE SURG SYN 8.5 PF PI (GLOVE) ×3 IMPLANT
GOWN SRG LRG LVL 4 IMPRV REINF (GOWNS) ×1 IMPLANT
GOWN SRG XL LVL 3 NONREINFORCE (GOWNS) ×1 IMPLANT
KIT TURNOVER KIT A (KITS) ×1 IMPLANT
MANIFOLD NEPTUNE II (INSTRUMENTS) ×1 IMPLANT
NS IRRIG 500ML POUR BTL (IV SOLUTION) ×1 IMPLANT
PACK LAMINECTOMY ARMC (PACKS) ×1 IMPLANT
PAD ARMBOARD POSITIONER FOAM (MISCELLANEOUS) ×2 IMPLANT
PIN CASPAR 14 (PIN) ×1 IMPLANT
PLATE ACP 1.6V PLATE 34 2L (Plate) IMPLANT
SCREW ACP 3.5X17 S/D VARIA (Screw) IMPLANT
SPACER CERVICAL FRGE 12X14X6-7 (Spacer) IMPLANT
SPACER CERVICAL FRGE 12X14X7-7 (Spacer) IMPLANT
SPONGE KITTNER 5P (MISCELLANEOUS) ×1 IMPLANT
SURGIFLO W/THROMBIN 8M KIT (HEMOSTASIS) ×1 IMPLANT
SUT STRATA 3-0 15 PS-2 (SUTURE) ×1 IMPLANT
SUT VIC AB 3-0 SH 8-18 (SUTURE) ×1 IMPLANT
SUTURE EHLN 3-0 FS-10 30 BLK (SUTURE) IMPLANT
SYR 20ML LL LF (SYRINGE) ×1 IMPLANT
TAPE CLOTH 3X10 WHT NS LF (GAUZE/BANDAGES/DRESSINGS) ×2 IMPLANT
TRAP FLUID SMOKE EVACUATOR (MISCELLANEOUS) ×1 IMPLANT

## 2023-09-16 NOTE — Transfer of Care (Signed)
 Immediate Anesthesia Transfer of Care Note  Patient: Mario Wilson  Procedure(s) Performed: ANTERIOR CERVICAL DECOMPRESSION/DISCECTOMY FUSION 2 LEVELS  Patient Location: PACU  Anesthesia Type:General  Level of Consciousness: drowsy and patient cooperative  Airway & Oxygen Therapy: Patient Spontanous Breathing and Patient connected to face mask oxygen  Post-op Assessment: Report given to RN and Post -op Vital signs reviewed and stable  Post vital signs: Reviewed and stable  Last Vitals:  Vitals Value Taken Time  BP 131/75 09/16/23 12:35  Temp 36.6 C 09/16/23 12:34  Pulse 82 09/16/23 12:39  Resp 17 09/16/23 12:39  SpO2 94 % 09/16/23 12:39  Vitals shown include unfiled device data.  Last Pain:  Vitals:   09/16/23 1234  TempSrc:   PainSc: 5          Complications: No notable events documented.

## 2023-09-16 NOTE — Discharge Instructions (Signed)

## 2023-09-16 NOTE — Anesthesia Postprocedure Evaluation (Signed)
 Anesthesia Post Note  Patient: Mario Wilson  Procedure(s) Performed: ANTERIOR CERVICAL DECOMPRESSION/DISCECTOMY FUSION 2 LEVELS  Patient location during evaluation: PACU Anesthesia Type: General Level of consciousness: awake and alert Pain management: pain level controlled Vital Signs Assessment: post-procedure vital signs reviewed and stable Respiratory status: spontaneous breathing, nonlabored ventilation and respiratory function stable Cardiovascular status: blood pressure returned to baseline and stable Postop Assessment: no apparent nausea or vomiting Anesthetic complications: no   No notable events documented.   Last Vitals:  Vitals:   09/16/23 1445 09/16/23 1503  BP: (!) 146/89 (!) 174/84  Pulse:  (!) 105  Resp: (!) 24 16  Temp: 36.7 C 36.5 C  SpO2: 91% (!) 89%    Last Pain:  Vitals:   09/16/23 1445  TempSrc:   PainSc: 5                  Fairy POUR [name]

## 2023-09-16 NOTE — Plan of Care (Signed)

## 2023-09-16 NOTE — Interval H&P Note (Signed)
 History and Physical Interval Note:  09/16/2023 9:43 AM  Mario Wilson  has presented today for surgery, with the diagnosis of G95.9 Cervical myelopathy M48.02 Cervical stenosis of spinal canal M54.12 Cervical radiculopathy.  The various methods of treatment have been discussed with the patient and family. After consideration of risks, benefits and other options for treatment, the patient has consented to  Procedure(s) with comments: ANTERIOR CERVICAL DECOMPRESSION/DISCECTOMY FUSION 2 LEVELS (N/A) - C3-5 ANTERIOR CERVICAL DISCECTOMY AND FUSION as a surgical intervention.  The patient's history has been reviewed, patient examined, no change in status, stable for surgery.  I have reviewed the patient's chart and labs.  Questions were answered to the patient's satisfaction.    Heart sounds normal no MRG. Chest Clear to Auscultation Bilaterally.   Ilias Stcharles

## 2023-09-16 NOTE — Anesthesia Preprocedure Evaluation (Addendum)
 Anesthesia Evaluation  Patient identified by MRN, date of birth, ID band Patient awake    Reviewed: Allergy & Precautions, NPO status , Patient's Chart, lab work & pertinent test results  History of Anesthesia Complications Negative for: history of anesthetic complications  Airway Mallampati: III  TM Distance: <3 FB Neck ROM: full    Dental  (+) Chipped   Pulmonary neg shortness of breath, former smoker   Pulmonary exam normal        Cardiovascular Exercise Tolerance: Good hypertension, (-) angina + CAD and + Cardiac Stents  (-) DOE Normal cardiovascular exam+ dysrhythmias      Neuro/Psych  Neuromuscular disease  negative psych ROS   GI/Hepatic negative GI ROS, Neg liver ROS,neg GERD  ,,  Endo/Other  negative endocrine ROS    Renal/GU      Musculoskeletal   Abdominal   Peds  Hematology negative hematology ROS (+)   Anesthesia Other Findings Patient has medical clearance for this procedure.   Past Medical History: No date: Bifascicular block (RBBB + LAFB) No date: Cervical myelopathy (HCC) No date: Cervical radiculopathy No date: Cervical stenosis (uterine cervix) No date: Coronary artery disease     Comment:  stents x 2 placed in [place]  in approximately 2014-2015 No date: ED (erectile dysfunction) No date: Hyperlipidemia No date: Hypertension No date: Hypogonadism in male No date: Osteoarthritis  Past Surgical History: 2015: CARDIAC CATHETERIZATION     Comment:  stent No date: COLONOSCOPY No date: CORONARY ANGIOPLASTY 07/04/2019: EXCISION MORTON'S NEUROMA; Left     Comment:  Procedure: EXCISION MORTON'S NEUROMA LEFT TOE;  Surgeon:              Ashley Soulier, DPM;  Location: [facility];                Service: Podiatry;  Laterality: Left;  LOCAL WITH IV 08/06/2020: EXCISION MORTON'S NEUROMA; Left     Comment:  Procedure: EXCISION MORTON'S NEUROMA- LEFT FT X2;                Surgeon:  Ashley Soulier, DPM;  Location: [facility];  Service: Podiatry;  Laterality: Left;  GENERAL               WITH LOCAL 08/20/2019: KNEE ARTHROPLASTY; Left     Comment:  Procedure: COMPUTER ASSISTED TOTAL KNEE ARTHROPLASTY;                Surgeon: Mardee Lynwood SQUIBB, MD;  Location: [facility] ORS;                Service: Orthopedics;  Laterality: Left; 11/12/2020: KNEE ARTHROPLASTY; Right     Comment:  Procedure: COMPUTER ASSISTED TOTAL KNEE ARTHROPLASTY;                Surgeon: Mardee Lynwood SQUIBB, MD;  Location: [facility] ORS;                Service: Orthopedics;  Laterality: Right; 02/2014: TOTAL HIP ARTHROPLASTY; Left  BMI    Body Mass Index: 33.91 kg/m      Reproductive/Obstetrics negative OB ROS                              Anesthesia Physical Anesthesia Plan  ASA: 3  Anesthesia Plan: General ETT   Post-op Pain Management:    Induction: Intravenous  PONV Risk Score and  Plan: Ondansetron , Dexamethasone , Midazolam  and Treatment may vary due to age or medical condition  Airway Management Planned: Oral ETT  Additional Equipment:   Intra-op Plan:   Post-operative Plan: Extubation in OR  Informed Consent: I have reviewed the patients History and Physical, chart, labs and discussed the procedure including the risks, benefits and alternatives for the proposed anesthesia with the patient or authorized representative who has indicated his/her understanding and acceptance.     Dental Advisory Given  Plan Discussed with: Anesthesiologist, CRNA and Surgeon  Anesthesia Plan Comments: (Patient consented for risks of anesthesia including but not limited to:  - adverse reactions to medications - damage to eyes, teeth, lips or other oral mucosa - nerve damage due to positioning  - sore throat or hoarseness - Damage to heart, brain, nerves, lungs, other parts of body or loss of life  Patient voiced understanding and assent.)          Anesthesia Quick Evaluation

## 2023-09-16 NOTE — Progress Notes (Signed)
   09/16/23 1503  Vitals  Temp 97.7 F (36.5 C)  BP (!) 174/84  MAP (mmHg) 107  BP Location Left Arm  BP Method Automatic  Patient Position (if appropriate) Lying  Pulse Rate (!) 105  Pulse Rate Source Monitor  Resp 16  MEWS COLOR  MEWS Score Color Green  Oxygen Therapy  SpO2 91 %  O2 Device Room Air  MEWS Score  MEWS Temp 0  MEWS Systolic 0  MEWS Pulse 1  MEWS RR 0  MEWS LOC 0  MEWS Score 1   Patient admitted from PACU alert on room air.  Head to toe skin assessment completed with all safety precautions in place.  Plan of care continues.

## 2023-09-16 NOTE — Op Note (Signed)
 Indications: [name] is a 73 y.o. male with G95.9 Cervical myelopathy, M48.02 Cervical stenosis of spinal canal, M54.12 Cervical radiculopathy   Findings: stenosis, successful decompression  Preoperative Diagnosis: G95.9 Cervical myelopathy, M48.02 Cervical stenosis of spinal canal, M54.12 Cervical radiculopathy  Postoperative Diagnosis: same   EBL: 50 ml IVF: see AR Drains: one Disposition: Extubated and Stable to PACU Complications: none  No foley catheter was placed.   Preoperative Note:    Risks of surgery discussed include: infection, bleeding, stroke, coma, death, paralysis, CSF leak, nerve/spinal cord injury, numbness, tingling, weakness, complex regional pain syndrome, recurrent stenosis and/or disc herniation, vascular injury, development of instability, neck/back pain, need for further surgery, persistent symptoms, development of deformity, and the risks of anesthesia. The patient understood these risks and agreed to proceed.   Procedure:  1) Anterior cervical diskectomy and fusion at C3/4 and C4/5 2) Anterior cervical instrumentation at C3 - 5 using Nuvasive ACP 3) Placement of structural allograft ([place]) 4) Use of operative microscope 5) Use of flouroscopy   Procedure: After obtaining informed consent, the patient taken to the operating room, placed in supine position, general anesthesia induced.  The patient had a small shoulder roll placed behind their shoulders.  The patient received preop antibiotics and IV Decadron .  A timeout was performed. The patient had a neck incision outlined, was prepped and draped in usual sterile fashion. The incision was injected with local anesthetic.   An incision was opened, dissection taken down medial to the carotid artery and jugular vein, lateral to the trachea and esophagus.  The prevertebral fascia identified and a localizing x-ray demonstrated the correct level.  The longus colli were dissected laterally, and  self-retaining retractors placed to open the operative field. The microscope was then brought into the field.  With this complete, distractor pins were placed in the vertebral bodies of C3 and C5. The distractor was placed, and the anuli at C3/4 and C4/5 were opened using a bovie.  Curettes and pituitary rongeurs used to remove the majority of disk, then the drill was used to remove the posterior osteophyte and begin the foraminotomies. The nerve hook was used to elevate the posterior longitudinal ligament, which was then removed with Kerrison rongeurs. The microblunt nerve hook could be passed out the foramen bilaterally at each level.   Meticulous hemostasis was obtained.  Structural allograft was tapped behind the anterior lip of the vertebral body at C3/4 (7 mm) and C4/5 (6 mm).    Please note that the procedure included removal of the disc, removal of the posterior osteophytes, and removal of the posterior longitudinal ligament to ensure decompression of the spinal cord.  Additionally, foraminotomies were performed on both sides of the spinal canal to decompress the nerve roots. This was performed at each level.  The caspar distractor was removed, and bone wax used for hemostasis. A separate, 34 mm 3 segment Nuvasive ACP plate was chosen.  Two screws placed in each vertebral body, respectively making sure the screws were behind the locking mechanism.  Final AP and lateral radiographs were taken.   Please note that the plate is not inclusive to the interbody structural allograft.  The anchoring mechanism of the plate is completely separate from the allograft.  A drain was placed.  With everything in good position, the wound was irrigated copiously and meticulous hemostasis obtained.  Wound was closed in 2 layers using interrupted inverted 3-0 Vicryl sutures.  The wound was dressed with dermabond, the head of bed  at 30 degrees, taken to recovery room in stable condition.  No new postop neurological  deficits were identified.  Sponge and pattie counts were correct at the end of the procedure.   Monitoring was stable throughout.   I performed the entire procedure with an RNFA. I performed the critical portions of the procedure.   Reeves Daisy MD

## 2023-09-16 NOTE — Anesthesia Procedure Notes (Addendum)
 Procedure Name: Intubation Date/Time: 09/16/2023 10:18 AM  Performed by: Lorriane Arabia, CRNAPre-anesthesia Checklist: Patient identified, Patient being monitored, Timeout performed, Emergency Drugs available and Suction available Patient Re-evaluated:Patient Re-evaluated prior to induction Oxygen Delivery Method: Circle system utilized Preoxygenation: Pre-oxygenation with 100% oxygen Induction Type: IV induction Ventilation: Mask ventilation without difficulty Laryngoscope Size: McGrath and 4 Grade View: Grade I Tube type: Oral Tube size: 7.0 mm Number of attempts: 1 Airway Equipment and Method: Stylet and Bite block (soft bite block placed) Placement Confirmation: ETT inserted through vocal cords under direct vision, positive ETCO2 and breath sounds checked- equal and bilateral Secured at: 21 cm Tube secured with: Tape Dental Injury: Teeth and Oropharynx as per pre-operative assessment

## 2023-09-17 DIAGNOSIS — M4802 Spinal stenosis, cervical region: Secondary | ICD-10-CM | POA: Diagnosis not present

## 2023-09-17 NOTE — Progress Notes (Signed)
 Reviewed discharge instructions with patient. Patient acknowledged understanding. Patient discharged with personal belongings. Patient wheeled out by staff. Patient transported home via family vehicle. No distress noted in patient.

## 2023-09-17 NOTE — Care Management Obs Status (Signed)
 MEDICARE OBSERVATION STATUS NOTIFICATION   Patient Details  Name: Mario Wilson MRN: [id number] Date of Birth: [birth year]   Medicare Observation Status Notification Given:  Yes    Rojelio [name] 09/17/2023, 11:43 AM

## 2023-09-17 NOTE — Plan of Care (Signed)

## 2023-09-17 NOTE — Evaluation (Signed)
 Physical Therapy Evaluation Patient Details Name: Mario Wilson MRN: [id number] DOB: 1950/07/20 Today's Date: 09/17/2023  History of Present Illness  73 y/o male s/o C3-5 ACDF on 7/10.  Clinical Impression  Pt did very well with POD1 PT assessment, able to perform bed mobility, transfers, prolonged bout of ambulation and negotiate steps w/o issue.  Pt did not need AD, showed no overt safety/balance issues and generally displayed good confidence with all requested acts.  He continues to have some baseline LE weakness/ROM limitations but was functional with community appropriate speed and tolerance with all acts.  Good overall effort, does not require further PT in house, continued PT per MD/surgeon recommendations/ACDF protocols.        If plan is discharge home, recommend the following:     Can travel by private vehicle        Equipment Recommendations None recommended by PT  Recommendations for Other Services       Functional Status Assessment Patient has not had a recent decline in their functional status     Precautions / Restrictions Precautions Precautions: Fall;Cervical Required Braces or Orthoses:  (no) Restrictions Weight Bearing Restrictions Per Provider Order: No      Mobility  Bed Mobility Overal bed mobility: Independent             General bed mobility comments: easily and confidently gets up to sitting EOB    Transfers Overall transfer level: Independent Equipment used: None               General transfer comment: no hesitation or safety issues with confidence getting to standing w/o AD    Ambulation/Gait Ambulation/Gait assistance: Modified independent (Device/Increase time) Gait Distance (Feet): 200 Feet Assistive device: None         General Gait Details: Pt able to ambulate safety at communtity appropriate speed, mild R side limp that is apparently baseline, no LOBs or safety issues.  Stairs Stairs: Yes Stairs assistance:  Modified independent (Device/Increase time) Stair Management: One rail Left Number of Stairs: 14 General stair comments: Pt able to negotiate steps without issue using single rail.  Utilized baseline step-to pattern  Wheelchair Mobility     Tilt Bed    Modified Rankin (Stroke Patients Only)       Balance Overall balance assessment: Independent                                           Pertinent Vitals/Pain Pain Assessment Pain Assessment: 0-10 Pain Score: 3     Home Living Family/patient expects to be discharged to:: Private residence Living Arrangements: Other relatives (nephew) Available Help at Discharge: Available 24 hours/day   Home Access: Stairs to enter Entrance Stairs-Rails: Left Entrance Stairs-Number of Steps: 7 Alternate Level Stairs-Number of Steps: flight Home Layout: Two level        Prior Function Prior Level of Function : Independent/Modified Independent             Mobility Comments: independent ADLs Comments: independent     Extremity/Trunk Assessment   Upper Extremity Assessment Upper Extremity Assessment: Overall WFL for tasks assessed    Lower Extremity Assessment Lower Extremity Assessment: Generalized weakness;Overall WFL for tasks assessed (baseline lack of b/l ankle DF past neutral, WFL strength but)       Communication   Communication Communication: No apparent difficulties    Cognition Arousal: Alert Behavior During Therapy:  WFL for tasks assessed/performed   PT - Cognitive impairments: No apparent impairments                         Following commands: Intact       Cueing Cueing Techniques: Verbal cues     General Comments General comments (skin integrity, edema, etc.): Pt moves with confidence and did not display safety concerns with mobility    Exercises     Assessment/Plan    PT Assessment Patient does not need any further PT services;All further PT needs can be met in the  next venue of care  PT Problem List Pain;Decreased strength;Decreased range of motion       PT Treatment Interventions      PT Goals (Current goals can be found in the Care Plan section)  Acute Rehab PT Goals Patient Stated Goal: go home soon PT Goal Formulation: All assessment and education complete, DC therapy    Frequency       Co-evaluation               AM-PAC PT 6 Clicks Mobility  Outcome Measure Help needed turning from your back to your side while in a flat bed without using bedrails?: None Help needed moving from lying on your back to sitting on the side of a flat bed without using bedrails?: None Help needed moving to and from a bed to a chair (including a wheelchair)?: None Help needed standing up from a chair using your arms (e.g., wheelchair or bedside chair)?: None Help needed to walk in hospital room?: None Help needed climbing 3-5 steps with a railing? : None 6 Click Score: 24    End of Session   Activity Tolerance: Patient tolerated treatment well Patient left: in chair;with call bell/phone within reach Nurse Communication: Mobility status PT Visit Diagnosis: Pain;Muscle weakness (generalized) (M62.81) Pain - Right/Left: Right Pain - part of body: Leg    Time: 9152-9095 PT Time Calculation (min) (ACUTE ONLY): 17 min   Charges:   PT Evaluation $PT Eval Low Complexity: 1 Low   PT General Charges $$ ACUTE PT VISIT: 1 Visit         Carmin JONELLE Deed, DPT 09/17/2023, 10:33 AM

## 2023-09-17 NOTE — Progress Notes (Signed)
 Neurosurgery visit note Patient doing extremely well this morning denies any pain has been up and mobilizing is been able to tolerate p.o. spine.  His pain is well-controlled.  His neck is supple nontender nondistended he has no dysphagia or dysphonia.  Physical exam is awake fluent and appropriate cranial nerves progress intact he was a poor extremity surgical strength and sensation.  AP: Patient is doing Sophia is okay to discharge and has arranged outpatient follow-up with neurosurgery and prescription been sent to his pharmacy.  Belvie PARAS. Deatrice, MD Neurosurgery

## 2023-09-17 NOTE — Evaluation (Signed)
 Occupational Therapy Evaluation Patient Details Name: Mario Wilson MRN: [id number] DOB: 22-Jul-1950 Today's Date: 09/17/2023   History of Present Illness   73 y/o male s/o C3-5 ACDF on 7/10.     Clinical Impressions Pt. Presents with 3/10 pain in the neck. Pt. Resides at home with someone, and has available help is needed. Pt. Was independent with ADLs, and IADL functioning prior to admission. Pt. Reports having had some balance issues prior to the surgery, however reports noticing that it is improving now. Pt. Reports that he is now independently going to and from the bathroom to complete toileting tasks. Pt. Is independent with LE dressing without A/E use, however has access to A/E, in storage, if needed following multiple previous orthopedic surgeries. Pt. Plans to discharge home today. No further OT services are warranted at this time.     If plan is discharge home, recommend the following:         Functional Status Assessment   Patient has had a recent decline in their functional status and demonstrates the ability to make significant improvements in function in a reasonable and predictable amount of time.     Equipment Recommendations         Recommendations for Other Services         Precautions/Restrictions   Precautions Precautions: Fall;Cervical Restrictions Weight Bearing Restrictions Per Provider Order: No     Mobility Bed Mobility Overal bed mobility: Independent                  Transfers Overall transfer level: Independent                        Balance  Sititng-good                                         ADL either performed or assessed with clinical judgement   ADL Overall ADL's : Needs assistance/impaired Eating/Feeding: Independent   Grooming: Independent           Upper Body Dressing : Independent;Set up   Lower Body Dressing: Independent;Set up   Toilet Transfer: Independent                    Vision Patient Visual Report: No change from baseline       Perception         Praxis         Pertinent Vitals/Pain Pain Assessment Pain Assessment: No/denies pain Pain Score: 3      Extremity/Trunk Assessment Upper Extremity Assessment Upper Extremity Assessment: Overall WFL for tasks assessed   Lower Extremity Assessment Lower Extremity Assessment: Generalized weakness;Overall WFL for tasks assessed (baseline lack of b/l ankle DF past neutral, WFL strength but)       Communication Communication Communication: No apparent difficulties   Cognition Arousal: Alert Behavior During Therapy: WFL for tasks assessed/performed Cognition: No apparent impairments                               Following commands: Intact       Cueing  General Comments   Cueing Techniques: Verbal cues  Pt moves with confidence and did not display safety concerns with mobility   Exercises     Shoulder Instructions      Home Living Family/patient expects to be discharged  to:: Private residence Living Arrangements: Other relatives Available Help at Discharge: Available 24 hours/day Type of Home: House Home Access: Level entry Entrance Stairs-Number of Steps: 2 floors-stairs inside home to the 2nd floor Entrance Stairs-Rails: Left Home Layout: Two level Alternate Level Stairs-Number of Steps: flight             Home Equipment: Agricultural consultant (2 wheels);Cane - single point (has ADL A/E in storage)          Prior Functioning/Environment Prior Level of Function : Independent/Modified Independent             Mobility Comments: independent ADLs Comments: independent    OT Problem List:     OT Treatment/Interventions:        OT Goals(Current goals can be found in the care plan section)   Acute Rehab OT Goals Patient Stated Goal: To return home today OT Goal Formulation: With patient Time For Goal Achievement: 09/17/23 Potential to  Achieve Goals: Good   OT Frequency:       Co-evaluation              AM-PAC OT 6 Clicks Daily Activity     Outcome Measure Help from another person eating meals?: None Help from another person taking care of personal grooming?: None Help from another person toileting, which includes using toliet, bedpan, or urinal?: None Help from another person bathing (including washing, rinsing, drying)?: None Help from another person to put on and taking off regular upper body clothing?: None Help from another person to put on and taking off regular lower body clothing?: None 6 Click Score: 24   End of Session    Activity Tolerance: Patient tolerated treatment well Patient left: in chair;with call bell/phone within reach;with chair alarm set                   Time: 8970-8962 OT Time Calculation (min): 8 min Charges:  OT General Charges $OT Visit: 1 Visit OT Evaluation $OT Eval Low Complexity: 1 Low Richardson Otter, MS, OTR/L Richardson Otter 09/17/2023, 12:49 PM

## 2023-09-17 NOTE — Plan of Care (Signed)

## 2023-09-19 ENCOUNTER — Encounter: Payer: Self-pay | Admitting: Neurosurgery

## 2023-09-20 ENCOUNTER — Telehealth: Payer: Self-pay | Admitting: Neurosurgery

## 2023-09-20 DIAGNOSIS — Z981 Arthrodesis status: Secondary | ICD-10-CM

## 2023-09-20 DIAGNOSIS — G959 Disease of spinal cord, unspecified: Secondary | ICD-10-CM

## 2023-09-20 MED ORDER — HYDROMORPHONE HCL 2 MG PO TABS
2.0000 mg | ORAL_TABLET | ORAL | 0 refills | Status: DC | PRN
Start: 2023-09-20 — End: 2023-10-27

## 2023-09-20 MED ORDER — METHYLPREDNISOLONE 4 MG PO TBPK: ORAL_TABLET | ORAL | 0 refills | Status: DC

## 2023-09-20 NOTE — Telephone Encounter (Signed)
 I spoke with the patient. He reports his pain is in the back and sides of his neck. His throat is a little sore, but it is improving.  He is taking the oxycodone  5mg  every 2 hours Methocarbamol  500 every 4 hours Tylenol  1000mg  every 4 hours  Despite the above, he is in significant pain. I advised that he not take more than prescribed and I will call him back as soon as one of our providers reviews and comes up with an alternate regimen for him.

## 2023-09-20 NOTE — Telephone Encounter (Signed)
 C3-5 ACDF on 09/16/23   Patient is calling to let our office know that the pain medication is not helping his pain at all. He states that he is in tears from the amount of pain he is in and would like to know if something else can be sent in. Please advise.   [facility] on [address]

## 2023-09-20 NOTE — Telephone Encounter (Signed)
 DOS: 09/16/23   ACDF C3-C5 for cervical myelopathy   Spoke with patient. He is not having any with oxycodone .   He's taken hydrocodone  in the past, not sure it helped. No relief with ultram .   Will stop oxycodone  and try dilaudid .   Take robaxin  as directed- every 6 hours prn.   Tylenol - max in 24 hours is 3000mg . He can take up to 1000mg  every 8 hours as needed.   Called his pharmacy to be sure they have dilaudid  in stock and they do. He is aware. Sent him a message with above instructions.

## 2023-09-25 NOTE — Progress Notes (Unsigned)
   REFERRING PHYSICIAN:  [facility] [address] [place],  [place] [address]  DOS: 09/16/23  ACDF C3-C5 for cervical myelopathy  HISTORY OF PRESENT ILLNESS: Mario Wilson is 2 weeks status post above. Given oxycodone  and robaxin  on discharge from the hospital.   He called last week with issues with pain control. He was to stop oxycodone  and was started on dilaudid . He was to continue robaxin  as directed and prn tylenol  with max of 3000mg  per day.   His pain is improved. He has some right sided neck pain to his shoulder that is tolerable. No arm pain. No numbness, tingling, or weakness. He thinks his balance is about the same, maybe a little better.   He is taking one diluadid a day. He is not taking robaxin . He is taking 2 tylenol  at night prn as well.    PHYSICAL EXAMINATION:  NEUROLOGICAL:  General: In no acute distress.   Awake, alert, oriented to person, place, and time.  Pupils equal round and reactive to light.  Facial tone is symmetric.    Strength: Side Biceps Triceps Deltoid Interossei Grip Wrist Ext. Wrist Flex.  R 5 5 5 5 5 5 5   L 5 5 5 5 5 5 5    Incision c/d/i  Imaging:  Nothing new to review.   Assessment / Plan: Mario Wilson is doing well s/p above surgery. Treatment options reviewed with patient and following plan made:   - We discussed activity escalation and I have advised the patient to lift up to 10 pounds until 6 weeks after surgery (until follow up with Dr. Clois).   - Reviewed wound care.  - Continue current medications including prn dilaudid . No refill needed. Will call if he needed.  - Follow up as scheduled in 4 weeks and prn. Will get xrays prior to surgery.   Advised to contact the office if any questions or concerns arise.   Glade Boys PA-C Dept of Neurosurgery

## 2023-09-29 ENCOUNTER — Ambulatory Visit (INDEPENDENT_AMBULATORY_CARE_PROVIDER_SITE_OTHER): Admitting: Orthopedic Surgery

## 2023-09-29 ENCOUNTER — Encounter: Payer: Self-pay | Admitting: Orthopedic Surgery

## 2023-09-29 VITALS — BP 130/82 | Temp 98.1°F | Ht 72.0 in | Wt 240.0 lb

## 2023-09-29 DIAGNOSIS — Z981 Arthrodesis status: Secondary | ICD-10-CM

## 2023-09-29 DIAGNOSIS — G959 Disease of spinal cord, unspecified: Secondary | ICD-10-CM

## 2023-09-30 ENCOUNTER — Encounter: Payer: Self-pay | Admitting: Neurosurgery

## 2023-10-21 NOTE — Discharge Summary (Signed)
 Physician Discharge Summary  Patient ID: Mario Wilson MRN: [id number] DOB/AGE: 73-Mar-1952 50 y.o.  Admit date: 09/16/2023 Discharge date: 09/17/2023  Admission Diagnoses: Principal Problem:   Cervical myelopathy [facility]) Active Problems:   Cervical radiculopathy   Spinal stenosis in cervical region  Discharge Diagnoses:  Principal Problem:   Cervical myelopathy (HCC) Active Problems:   Cervical radiculopathy   Spinal stenosis in cervical region   Discharged Condition: good  Hospital Course:  Mario Wilson presented for surgical intervention.  He tolerated procedure well and was admitted for observation.  On postop day 1, he cleared physical and Occupational Therapy and was felt to be stable for discharge.  Consults: None  Significant Diagnostic Studies: radiology: X-Ray: localization   Treatments: surgery: C3-5 ACDF  Discharge Exam: Blood pressure (!) 141/89, pulse 89, temperature 98.3 F (36.8 C), resp. rate 18, height 6' (1.829 m), weight 113.4 kg, SpO2 91%. General appearance: alert and cooperative CNI MAEW  Disposition: Discharge disposition: 01-Home or Self Care       Discharge Instructions     Incentive spirometry RT   Complete by: As directed       Allergies as of 09/17/2023   No Known Allergies      Medication List     STOP taking these medications    ibuprofen 600 MG tablet Commonly known as: ADVIL       TAKE these medications    amLODipine  10 MG tablet Commonly known as: NORVASC  Take 10 mg by mouth daily.   atorvastatin  40 MG tablet Commonly known as: LIPITOR Take 40 mg by mouth daily.   cholecalciferol 25 MCG (1000 UNIT) tablet Commonly known as: VITAMIN D3 Take 1,000 Units by mouth daily.   diphenhydramine -acetaminophen  25-500 MG Tabs tablet Commonly known as: TYLENOL  PM Take 1 tablet by mouth at bedtime as needed.   lisinopril -hydrochlorothiazide  20-25 MG tablet Commonly known as: ZESTORETIC  Take 2 tablets by mouth daily.    magnesium  30 MG tablet Take 30 mg by mouth daily.   methocarbamol  500 MG tablet Commonly known as: ROBAXIN  Take 1 tablet (500 mg total) by mouth every 6 (six) hours as needed for muscle spasms.   multivitamin tablet Take 1 tablet by mouth daily.   vitamin C 1000 MG tablet Take 1,000 mg by mouth daily.        Follow-up Information     Hilma Hastings, PA-C Follow up on 09/29/2023.   Specialty: Neurosurgery Why: 1130 Contact information: [address] [place] [address] [phone]                 Signed: REEVES DAISY 10/21/2023, 7:09 AM

## 2023-10-24 ENCOUNTER — Other Ambulatory Visit: Payer: Self-pay

## 2023-10-24 DIAGNOSIS — M5412 Radiculopathy, cervical region: Secondary | ICD-10-CM

## 2023-10-24 DIAGNOSIS — G959 Disease of spinal cord, unspecified: Secondary | ICD-10-CM

## 2023-10-27 ENCOUNTER — Ambulatory Visit
Admission: RE | Admit: 2023-10-27 | Discharge: 2023-10-27 | Disposition: A | Discharge status: Home / Self Care | Attending: Neurosurgery | Admitting: Neurosurgery

## 2023-10-27 ENCOUNTER — Encounter: Payer: Self-pay | Admitting: Neurosurgery

## 2023-10-27 ENCOUNTER — Ambulatory Visit (INDEPENDENT_AMBULATORY_CARE_PROVIDER_SITE_OTHER): Admitting: Neurosurgery

## 2023-10-27 ENCOUNTER — Ambulatory Visit
Admission: RE | Admit: 2023-10-27 | Discharge: 2023-10-27 | Disposition: A | Discharge status: Home / Self Care | Source: Ambulatory Visit | Attending: Neurosurgery | Admitting: Neurosurgery

## 2023-10-27 DIAGNOSIS — G959 Disease of spinal cord, unspecified: Secondary | ICD-10-CM | POA: Diagnosis present

## 2023-10-27 DIAGNOSIS — Z981 Arthrodesis status: Secondary | ICD-10-CM

## 2023-10-27 DIAGNOSIS — M5412 Radiculopathy, cervical region: Secondary | ICD-10-CM | POA: Diagnosis present

## 2023-10-27 MED ORDER — HYDROMORPHONE HCL 2 MG PO TABS: 2.0000 mg | ORAL_TABLET | ORAL | 0 refills | Status: DC | PRN

## 2023-10-27 MED ORDER — METHOCARBAMOL 500 MG PO TABS: 500.0000 mg | ORAL_TABLET | Freq: Four times a day (QID) | ORAL | 0 refills | Status: DC | PRN

## 2023-10-27 MED ORDER — CELECOXIB 200 MG PO CAPS
200.0000 mg | ORAL_CAPSULE | Freq: Two times a day (BID) | ORAL | 2 refills | Status: AC
Start: 2023-10-27 — End: 2024-10-26

## 2023-10-27 NOTE — Progress Notes (Signed)
   REFERRING PHYSICIAN:  [facility] [address] [place],  [place] [address]  DOS: 09/16/23  ACDF C3-C5 for cervical myelopathy  HISTORY OF PRESENT ILLNESS: Mario Wilson is status post cervical surgery for myelopathy.  He is still having neck discomfort and discomfort into his right arm.  He still has difficulty with lifting his right arm.  He is still having difficulty with his balance.  He is very frustrated.    He reported some issues with numbness in his toes.      PHYSICAL EXAMINATION:  NEUROLOGICAL:  General: In no acute distress.   Awake, alert, oriented to person, place, and time.  Pupils equal round and reactive to light.  Facial tone is symmetric.    Strength: Side Biceps Triceps Deltoid Interossei Grip Wrist Ext. Wrist Flex.  R 5 5 5 5 5 5 5   L 5 5 5 5 5 5 5    Incision c/d/i  Imaging:  No complications noted   Assessment / Plan: Mario Wilson is doing fair s/p above surgery.  Will start physical therapy for his neck, right shoulder, and balance.  If he is not improved at his next appointment, we will reimage his cervical spine and reevaluate for any other possible causes of numbness and imbalance.  That may include neurology referral for nerve conduction study.  I will refill his medications for pain.  We discussed pain management.  I am hopeful that he will wean off of his hydromorphone  by his next visit.     Reeves Daisy Dept of Neurosurgery

## 2023-10-31 NOTE — Therapy (Unsigned)
 OUTPATIENT PHYSICAL THERAPY POST-OP EVALUATION (CERVICAL FUSION)   Patient Name: Mario Wilson MRN: [id number] DOB:03-14-1950, 73 y.o., male Today's Date: 11/01/2023  END OF SESSION:  PT End of Session - 11/01/23 1301     Visit Number 1    Number of Visits 21    Date for PT Re-Evaluation 01/10/24    PT Start Time 1300    PT Stop Time 1345    PT Time Calculation (min) 45 min    Equipment Utilized During Treatment Gait belt    Activity Tolerance Patient tolerated treatment well    Behavior During Therapy WFL for tasks assessed/performed          Past Medical History:  Diagnosis Date   Bifascicular block (RBBB + LAFB)    Cervical myelopathy (HCC)    Cervical radiculopathy    Cervical stenosis (uterine cervix)    Coronary artery disease    stents x 2 placed in [place]  in approximately 2014-2015   ED (erectile dysfunction)    Hyperlipidemia    Hypertension    Hypogonadism in male    Osteoarthritis    Past Surgical History:  Procedure Laterality Date   ANTERIOR CERVICAL DECOMP/DISCECTOMY FUSION N/A 09/16/2023   Procedure: ANTERIOR CERVICAL DECOMPRESSION/DISCECTOMY FUSION 2 LEVELS;  Surgeon: Clois Fret, MD;  Location: [facility];  Service: Neurosurgery;  Laterality: N/A;  C3-5 ANTERIOR CERVICAL DISCECTOMY AND FUSION   CARDIAC CATHETERIZATION  2015   stent   COLONOSCOPY     CORONARY ANGIOPLASTY     EXCISION MORTON'S NEUROMA Left 07/04/2019   Procedure: EXCISION MORTON'S NEUROMA LEFT TOE;  Surgeon: Ashley Soulier, DPM;  Location: [facility];  Service: Podiatry;  Laterality: Left;  LOCAL WITH IV   EXCISION MORTON'S NEUROMA Left 08/06/2020   Procedure: EXCISION MORTON'S NEUROMA- LEFT FT X2;  Surgeon: Ashley Soulier, DPM;  Location: [facility];  Service: Podiatry;  Laterality: Left;  GENERAL WITH LOCAL   KNEE ARTHROPLASTY Left 08/20/2019   Procedure: COMPUTER ASSISTED TOTAL KNEE ARTHROPLASTY;  Surgeon: Mardee Lynwood SQUIBB, MD;  Location: [facility];  Service:  Orthopedics;  Laterality: Left;   KNEE ARTHROPLASTY Right 11/12/2020   Procedure: COMPUTER ASSISTED TOTAL KNEE ARTHROPLASTY;  Surgeon: Mardee Lynwood SQUIBB, MD;  Location: [facility];  Service: Orthopedics;  Laterality: Right;   TOTAL HIP ARTHROPLASTY Left 02/2014   Patient Active Problem List   Diagnosis Date Noted   Cervical myelopathy (HCC) 09/16/2023   Cervical radiculopathy 09/16/2023   Spinal stenosis in cervical region 09/16/2023   Total knee replacement status 11/12/2020   Status post total left knee replacement 08/20/2019   History of coronary artery stent placement 08/16/2019   Severe obesity (BMI 35.0-39.9) with comorbidity (HCC) 08/16/2019   Primary osteoarthritis of right knee 09/03/2018   2-vessel coronary artery disease 02/05/2015   Erectile dysfunction 02/05/2015   Essential hypertension 02/05/2015   Hx of hypogonadism 02/05/2015   Mixed hyperlipidemia 02/05/2015    PCP: Trinidad Cassondra BROCKS, MD  REFERRING PROVIDER: Clois Fret, MD  REFERRING DIAG:  G95.9 (ICD-10-CM) - Cervical myelopathy (HCC)  Z98.1 (ICD-10-CM) - S/P cervical spinal fusion   RATIONALE FOR EVALUATION AND TREATMENT: Rehabilitation  THERAPY DIAG: S/P cervical spinal fusion  Cervicalgia  Muscle weakness (generalized)  Difficulty in walking, not elsewhere classified  ONSET DATE: S/p ACDF C3-5 for myelopathy (DOS: 09/16/23)  FOLLOW-UP APPT SCHEDULED WITH REFERRING PROVIDER: Yes ; F/u late next month    SUBJECTIVE:  Chief Complaint: Pt is a 73 year old male s/p ACDF C3-5 09/16/23 for cervical myelopathy; pt currently referred to PT for neck, R shoulder, imbalance.   Pertinent History Pt is a 73 year old male s/p ACDF C3-5 09/16/23 for cervical myelopathy; pt currently referred to PT for neck, R shoulder, imbalance.    Pt had notable difficulty with pain control early post-operatively. He feels that his current pain is manageable - largely localized to R paracervical region/medial R upper trapezius. He states he has exercises from Dr. Clois given for neck - no specific exercises named. Patient is more concerned with gait/balance and ability to get around and has not been given specific exercises for this. He reports challenge with functional ER with R upper limb today; he has functional forward elevation of bilat shoulders. Pt still under 10-lb lifting restriction to his knowledge.   Pt reports primary concern of unsteadiness. No recent falls. He reports numbness in toes of either foot.   Pain:  Pain Intensity: Present: 4-5/10, Best: 0/10, Worst: 5/10 Pain location: R paracervical/medial R upper trap  Pain Quality: steady ache Radiating: No  Numbness/Tingling: Yes; numbness in toes, none in upper limbs per pt Focal Weakness: Yes; lifting toes Aggravating factors: activity throuhgout day  Relieving factors: lying down  24-hour pain behavior: worse as day goes on  History of prior neck injury, pain, surgery, or therapy: Yes; Hx of bilat TKA, cervical fusion C3-5 09/16/23, L THA Dominant hand: right Imaging: Yes; post-op follow-up imaging, no comment from MD per pt on any hardware concerns   Red flags (personal history of cancer, h/o spinal tumors, history of compression fracture, chills/fever, night sweats, nausea, vomiting, unrelenting pain): Negative  PRECAUTIONS: Other: Lifting precaution - up to 10 lbs     WEIGHT BEARING RESTRICTIONS: No  FALLS: Has patient fallen in last 6 months? 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  Recent memory is intact.  Remote memory is intact.  Attention span and concentration are intact.  Expressive speech is intact.  Patient's fund of knowledge is within normal limits for educational level.    Gross Musculoskeletal Assessment Tremor: None Bulk: Normal Tone: Normal  Gait Poor eccentric control of dorsiflexors bilat form heel strike to foot flat; dec single-limb support time, dec arm swing  Posture Forward head, guarded posture   AROM AROM (Normal range in degrees) AROM 10/31/23  Cervical  Flexion (50) 32  Extension (80) 28  Right lateral flexion (45) 25  Left lateral flexion (45) 20  Right rotation (85) 47  Left rotation (85) 42  (* = pain; Blank rows = not tested)  Shoulder forward elevation AROM WNL Functional ER: R significant motion loss, L WFL  MMT/upper limb quick motor screen MMT (out of 5) Right 10/31/23 Left 10/31/23      Shoulder   Flexion 4- 4-  Abduction 4- 4-  Internal rotation    External rotation  3+ 4  Horizontal abduction    Horizontal adduction    Lower Trapezius  Rhomboids        Elbow  Flexion 4- 4-  Extension 4- 4-  Pronation    Supination        Wrist  Flexion    Extension 4 4  Radial deviation    Ulnar deviation    (* = pain; Blank rows = not tested)  R/L 4/4 Hip flexion 5/5 Hip abduction (seated) 5/5 Hip adduction (seated) 5/5 Knee extension 5/5 Knee flexion 4-/4- Ankle Dorsiflexion 5/5 Ankle Plantarflexion *indicates pain   Sensation Grossly intact to light touch bilateral UE/LE as determined by testing dermatomes C2-L5. Proprioception and hot/cold testing deferred on this date. Subjective numbness in toes; light touch remains  intact.   Reflexes Deferred  Palpation Deferred   SPECIAL TESTS Deferred   FUNCTIONAL TESTING TUG: 12.2 sec 5TSTS: 10 sec  BERG: 36/56 (see flowsheet)     TODAY'S TREATMENT    11/01/23   Therapeutic Exercise - for HEP establishment, discussion on appropriate exercise/activity modification, PT education   Reviewed baseline home exercises and provided handout for [employer] program (see Access Code); tactile cueing and therapist demonstration utilized as needed for carryover of proper technique to HEP.    Patient education on current condition, role of PT, prognosis, plan of care. We reviewed current post-op restrictions per surgeon.     PATIENT EDUCATION:  Education details: see above for patient education details Person educated: Patient Education method: Explanation, Demonstration, and Handouts Education comprehension: verbalized understanding and returned demonstration   HOME EXERCISE PROGRAM:  Access Code: [email or web address] URL: [email or web address] Date: 11/02/2023 Prepared by: Venetia Endo  Exercises - Seated Heel Toe Raises  - 2 x daily - 7 x weekly - 2 sets - 10 reps - Sit to Stand  - 2 x daily - 7 x weekly - 2 sets - 10 reps - Standing Toe Taps  - 2 x daily - 7 x weekly - 2 sets - 10 reps   ASSESSMENT:  CLINICAL IMPRESSION: Patient is a 73 y.o. male who was seen today for physical therapy evaluation and treatment s/p C3-5 ACDF (DOS: 09/16/23). Patient reports mild to moderate localized posterior neck pain at this time largely affecting R paracervical region with mild motion loss relative to post-operative status/fusion. He has remaining lifting restriction post-operatively and tolerates low-resistance MMTs well for upper limb. Patient has primary concern at this time for impaired gait/functional mobility. He demonstrates impaired gait with dec eccentric control of dorsiflexors from heel strike to foot flat and dec single-limb support time. No recent  falls fortunately. Pt has expected post-op impairments in cervical spine AROM, local nociceptive neck pain, upper limb strength deficit, dec hip flexor and dorsiflexor strength deficit, impaired postural control/balance. Pt will continue to benefit from skilled PT services to address deficits and improve function.   OBJECTIVE IMPAIRMENTS: Abnormal gait, decreased balance, difficulty walking, decreased ROM, decreased strength, hypomobility, impaired flexibility, impaired UE functional use, postural dysfunction, and pain.   ACTIVITY LIMITATIONS: carrying, lifting, standing, stairs, transfers, and locomotion level  PARTICIPATION LIMITATIONS: driving, shopping, and community activity  PERSONAL FACTORS: Age, Past/current experiences, and 3+ comorbidities: (Hx cervical myelopathy, CAD, HLD, HTN, OA) are also affecting patient's functional outcome.   REHAB POTENTIAL: Good  CLINICAL DECISION MAKING: Unstable/unpredictable  EVALUATION COMPLEXITY: High   GOALS: Goals reviewed with patient? Yes  SHORT TERM GOALS: Target date: 11/23/2023  Pt will be independent with HEP to improve strength and movement control to improve balance and function at home and in community. Baseline: 11/02/23: Baseline HEP initiated.  Goal status: INITIAL   LONG TERM GOALS: Target date: 12/29/2023  Pt will improve BERG to 45/56 or greater indicative of decreased fall risk and improved balance Baseline: 11/02/23: 36/56 Goal status: INITIAL  2.  Pt will decrease worst neck pain by at least 2 points on the NPRS in order to demonstrate clinically significant reduction in neck pain. Baseline: 11/02/23: 5/10 pain at worst.  Goal status: INITIAL  3.  Pt will improve ABC Scale by at least 13% or greater indicative of clinically meaningful change in pt's balance confidence and function.      Baseline: 11/02/23: 47.5% Goal status: INITIAL  4.  Pt will improve DGI by at least 3 points in order to demonstrate clinically  significant improvement in balance and decreased risk for falls.    Baseline: 11/02/23: DGI to be completed on visit #2.  Goal status: INITIAL  5.  Pt will have MMT 4+/5 or greater for all tested UE musculature (pending clearance from physician for activity escalation/lifting of 10-lb restriction) indicative of improved capacity for lifting and carrying  Baseline: 11/02/23: Current lifting restriction, MMTs 3+ to 4/5. Goal status: INITIAL  PLAN: PT FREQUENCY: 1-2x/week  PT DURATION: 8 weeks  PLANNED INTERVENTIONS: Therapeutic exercises, Therapeutic activity, Neuromuscular re-education, Balance training, Gait training, Patient/Family education, Self Care, Joint mobilization, Joint manipulation, Vestibular training, Canalith repositioning, Orthotic/Fit training, DME instructions, Dry Needling, Electrical stimulation, Spinal manipulation, Spinal mobilization, Cryotherapy, Moist heat, Taping, Traction, Ultrasound, Ionotophoresis 4mg /ml Dexamethasone , Manual therapy, and Re-evaluation.  PLAN FOR NEXT SESSION: Complete DGI. Complete NDI at future date. Continue with weight shifting and dynamic balance drills requiring brief unipedal support. Postural control training. Update ROM/stretching program for C-spine as needed.    Venetia Endo, PT, DPT #[id number]  Venetia ONEIDA Endo, PT 11/02/2023, 8:11 AM

## 2023-11-01 ENCOUNTER — Ambulatory Visit: Attending: Neurosurgery | Admitting: Physical Therapy

## 2023-11-01 ENCOUNTER — Encounter: Payer: Self-pay | Admitting: Physical Therapy

## 2023-11-01 DIAGNOSIS — R262 Difficulty in walking, not elsewhere classified: Secondary | ICD-10-CM | POA: Insufficient documentation

## 2023-11-01 DIAGNOSIS — M6281 Muscle weakness (generalized): Secondary | ICD-10-CM | POA: Insufficient documentation

## 2023-11-01 DIAGNOSIS — Z981 Arthrodesis status: Secondary | ICD-10-CM | POA: Insufficient documentation

## 2023-11-01 DIAGNOSIS — G959 Disease of spinal cord, unspecified: Secondary | ICD-10-CM | POA: Diagnosis not present

## 2023-11-01 DIAGNOSIS — M542 Cervicalgia: Secondary | ICD-10-CM | POA: Diagnosis present

## 2023-11-02 ENCOUNTER — Encounter: Payer: Self-pay | Admitting: Physical Therapy

## 2023-11-03 ENCOUNTER — Ambulatory Visit

## 2023-11-03 DIAGNOSIS — R262 Difficulty in walking, not elsewhere classified: Secondary | ICD-10-CM

## 2023-11-03 DIAGNOSIS — Z981 Arthrodesis status: Secondary | ICD-10-CM | POA: Diagnosis not present

## 2023-11-03 DIAGNOSIS — M542 Cervicalgia: Secondary | ICD-10-CM

## 2023-11-03 DIAGNOSIS — M6281 Muscle weakness (generalized): Secondary | ICD-10-CM

## 2023-11-03 NOTE — Therapy (Signed)
 OUTPATIENT PHYSICAL THERAPY POST-OP TREATMENT (CERVICAL FUSION)   Patient Name: Mario Wilson MRN: [id number] DOB:Jan 11, 1951, 73 y.o., male Today's Date: 11/01/2023  END OF SESSION:  PT End of Session - 11/03/23 1307     Visit Number 2    Number of Visits 21    Date for PT Re-Evaluation 01/10/24    Authorization Type eval: 11/01/23    PT Start Time 1315    PT Stop Time 1400    PT Time Calculation (min) 45 min    Equipment Utilized During Treatment Gait belt    Activity Tolerance Patient tolerated treatment well    Behavior During Therapy WFL for tasks assessed/performed          Past Medical History:  Diagnosis Date   Bifascicular block (RBBB + LAFB)    Cervical myelopathy (HCC)    Cervical radiculopathy    Cervical stenosis (uterine cervix)    Coronary artery disease    stents x 2 placed in [place]  in approximately 2014-2015   ED (erectile dysfunction)    Hyperlipidemia    Hypertension    Hypogonadism in male    Osteoarthritis    Past Surgical History:  Procedure Laterality Date   ANTERIOR CERVICAL DECOMP/DISCECTOMY FUSION N/A 09/16/2023   Procedure: ANTERIOR CERVICAL DECOMPRESSION/DISCECTOMY FUSION 2 LEVELS;  Surgeon: Clois Fret, MD;  Location: [facility];  Service: Neurosurgery;  Laterality: N/A;  C3-5 ANTERIOR CERVICAL DISCECTOMY AND FUSION   CARDIAC CATHETERIZATION  2015   stent   COLONOSCOPY     CORONARY ANGIOPLASTY     EXCISION MORTON'S NEUROMA Left 07/04/2019   Procedure: EXCISION MORTON'S NEUROMA LEFT TOE;  Surgeon: Ashley Soulier, DPM;  Location: [facility];  Service: Podiatry;  Laterality: Left;  LOCAL WITH IV   EXCISION MORTON'S NEUROMA Left 08/06/2020   Procedure: EXCISION MORTON'S NEUROMA- LEFT FT X2;  Surgeon: Ashley Soulier, DPM;  Location: [facility];  Service: Podiatry;  Laterality: Left;  GENERAL WITH LOCAL   KNEE ARTHROPLASTY Left 08/20/2019   Procedure: COMPUTER ASSISTED TOTAL KNEE ARTHROPLASTY;  Surgeon: Mardee Lynwood SQUIBB,  MD;  Location: [facility];  Service: Orthopedics;  Laterality: Left;   KNEE ARTHROPLASTY Right 11/12/2020   Procedure: COMPUTER ASSISTED TOTAL KNEE ARTHROPLASTY;  Surgeon: Mardee Lynwood SQUIBB, MD;  Location: [facility];  Service: Orthopedics;  Laterality: Right;   TOTAL HIP ARTHROPLASTY Left 02/2014   Patient Active Problem List   Diagnosis Date Noted   Cervical myelopathy (HCC) 09/16/2023   Cervical radiculopathy 09/16/2023   Spinal stenosis in cervical region 09/16/2023   Total knee replacement status 11/12/2020   Status post total left knee replacement 08/20/2019   History of coronary artery stent placement 08/16/2019   Severe obesity (BMI 35.0-39.9) with comorbidity (HCC) 08/16/2019   Primary osteoarthritis of right knee 09/03/2018   2-vessel coronary artery disease 02/05/2015   Erectile dysfunction 02/05/2015   Essential hypertension 02/05/2015   Hx of hypogonadism 02/05/2015   Mixed hyperlipidemia 02/05/2015    PCP: Trinidad Cassondra BROCKS, MD  REFERRING PROVIDER: Clois Fret, MD  REFERRING DIAG:  G95.9 (ICD-10-CM) - Cervical myelopathy (HCC)  Z98.1 (ICD-10-CM) - S/P cervical spinal fusion   RATIONALE FOR EVALUATION AND TREATMENT: Rehabilitation  THERAPY DIAG: Muscle weakness (generalized)  Difficulty in walking, not elsewhere classified  Cervicalgia  ONSET DATE: S/p ACDF C3-5 for myelopathy (DOS: 09/16/23)  FOLLOW-UP APPT SCHEDULED WITH REFERRING PROVIDER: Yes ; F/u late next month   FROM INITIAL EVALUATION SUBJECTIVE:  Chief Complaint: Pt is a 73 year old male s/p ACDF C3-5 09/16/23 for cervical myelopathy; pt currently referred to PT for neck, R shoulder, imbalance.   Pertinent History Pt is a 73 year old male s/p ACDF C3-5 09/16/23 for cervical myelopathy; pt currently referred to PT for  neck, R shoulder, imbalance.   Pt had notable difficulty with pain control early post-operatively. He feels that his current pain is manageable - largely localized to R paracervical region/medial R upper trapezius. He states he has exercises from Dr. Clois given for neck - no specific exercises named. Patient is more concerned with gait/balance and ability to get around and has not been given specific exercises for this. He reports challenge with functional ER with R upper limb today; he has functional forward elevation of bilat shoulders. Pt still under 10-lb lifting restriction to his knowledge.   Pt reports primary concern of unsteadiness. No recent falls. He reports numbness in toes of either foot.   Pain:  Pain Intensity: Present: 4-5/10, Best: 0/10, Worst: 5/10 Pain location: R paracervical/medial R upper trap  Pain Quality: steady ache Radiating: No  Numbness/Tingling: Yes; numbness in toes, none in upper limbs per pt Focal Weakness: Yes; lifting toes Aggravating factors: activity throuhgout day  Relieving factors: lying down  24-hour pain behavior: worse as day goes on  History of prior neck injury, pain, surgery, or therapy: Yes; Hx of bilat TKA, cervical fusion C3-5 09/16/23, L THA Dominant hand: right Imaging: Yes; post-op follow-up imaging, no comment from MD per pt on any hardware concerns   Red flags (personal history of cancer, h/o spinal tumors, history of compression fracture, chills/fever, night sweats, nausea, vomiting, unrelenting pain): Negative  PRECAUTIONS: Other: Lifting precaution - up to 10 lbs     WEIGHT BEARING RESTRICTIONS: No  FALLS: Has patient fallen in last 6 months? 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  Recent memory is intact.  Remote memory is intact.  Attention span and concentration are intact.  Expressive speech is intact.  Patient's fund of knowledge is within normal limits for educational level.    Gross Musculoskeletal Assessment Tremor: None Bulk: Normal Tone: Normal  Gait Poor eccentric control of dorsiflexors bilat form heel strike to foot flat; dec single-limb support time, dec arm swing  Posture Forward head, guarded posture   AROM AROM (Normal range in degrees) AROM 10/31/23  Cervical  Flexion (50) 32  Extension (80) 28  Right lateral flexion (45) 25  Left lateral flexion (45) 20  Right rotation (85) 47  Left rotation (85) 42  (* = pain; Blank rows = not tested)  Shoulder forward elevation AROM WNL Functional ER: R significant motion loss, L WFL  MMT/upper limb quick motor screen MMT (out of 5) Right 10/31/23 Left 10/31/23      Shoulder   Flexion 4- 4-  Abduction 4- 4-  Internal rotation    External rotation  3+ 4  Horizontal abduction    Horizontal adduction    Lower Trapezius    Rhomboids  Elbow  Flexion 4- 4-  Extension 4- 4-  Pronation    Supination        Wrist  Flexion    Extension 4 4  Radial deviation    Ulnar deviation    (* = pain; Blank rows = not tested)  R/L 4/4 Hip flexion 5/5 Hip abduction (seated) 5/5 Hip adduction (seated) 5/5 Knee extension 5/5 Knee flexion 4-/4- Ankle Dorsiflexion 5/5 Ankle Plantarflexion *indicates pain  Sensation Grossly intact to light touch bilateral UE/LE as determined by testing dermatomes C2-L5. Proprioception and hot/cold testing deferred on this date. Subjective numbness in toes; light touch  remains intact.   Reflexes Deferred  Palpation Deferred   SPECIAL TESTS Deferred   FUNCTIONAL TESTING TUG: 12.2 sec 5TSTS: 10 sec  BERG: 36/56 (see flowsheet)   [facility] PT Assessment - 11/03/23 0001       Standardized Balance Assessment   Standardized Balance Assessment Dynamic Gait Index      Dynamic Gait Index   Level Surface Mild Impairment    Change in Gait Speed Normal    Gait with Horizontal Head Turns Mild Impairment    Gait with Vertical Head Turns Mild Impairment    Gait and Pivot Turn Normal    Step Over Obstacle Mild Impairment    Step Around Obstacles Normal    Steps Mild Impairment    Total Score 19          [facility] PT Assessment - 11/03/23 0001       Standardized Balance Assessment   Standardized Balance Assessment Dynamic Gait Index      Dynamic Gait Index   Level Surface Mild Impairment    Change in Gait Speed Normal    Gait with Horizontal Head Turns Mild Impairment    Gait with Vertical Head Turns Mild Impairment    Gait and Pivot Turn Normal    Step Over Obstacle Mild Impairment    Step Around Obstacles Normal    Steps Mild Impairment    Total Score 19           TODAY'S TREATMENT    11/03/23   SUBJECTIVE: Pt reports that he is doing well today. No changes since the initial evaluation. He reports 5-6/10 neck pain upon arrival. He brought his home program from the surgeon which included AROM neck rotation, face clocks, scap retractions, shoulder flexion, supine/sidelying neck lifts, and quadruped forward arm  reaches. Pt expresses ongoing frustration regarding imbalance.   PAIN: 5-6/10 neck pain;   Therapeutic Activity  NuStep (seated and arms 12) L1-4 x 10 minutes for BLE strengthening and cardiovascular challenge during interval history;  Standing hip strengthening with 5# ankle weights: Hip flexion marches x 10 BLE; HS curls x 10 BLE; Hip abduction x 10 BLE; Hip extension x 10 BLE;  Seated LAQ with 5# ankle weights x 10  BLE;   Neuromuscular Re-education  NDI: 28% DGI: 19/24 mCTSIB: conditions 1-2: 30s, condition 3: 5s; condition 4: 3s; Semitandem balance alternating forward LE x 60s each; Semitandem balance alternating forward LE with horizontal and vertical head turns x 60s each; Tandem gait in // bars (very difficult for patient);   PATIENT EDUCATION:  Education details: Pt educated throughout session about proper posture and technique with exercises. Improved exercise technique, movement at target joints, use of target muscles after min to mod verbal, visual, tactile cues.  Person educated: Patient Education method: Medical illustrator Education comprehension: verbalized understanding and returned demonstration   HOME EXERCISE PROGRAM:  Access  Code: [email or web address] URL: [email or web address] Date: 11/02/2023 Prepared by: Venetia Endo  Exercises - Seated Heel Toe Raises  - 2 x daily - 7 x weekly - 2 sets - 10 reps - Sit to Stand  - 2 x daily - 7 x weekly - 2 sets - 10 reps - Standing Toe Taps  - 2 x daily - 7 x weekly - 2 sets - 10 reps   ASSESSMENT:  CLINICAL IMPRESSION: Initiated balance and functional strength exercises during session today with patient. Pt scored 28% on the NDI and new goal written. DGI of 19/24 demonstrates imbalance with increased risk for falls. No HEP updates at this time. Pt encouraged to follow-up as scheduled. Pt will benefit from PT services to address deficits in strength, balance, and mobility in order to improve function at home and decrease his risk for falls.     OBJECTIVE IMPAIRMENTS: Abnormal gait, decreased balance, difficulty walking, decreased ROM, decreased strength, hypomobility, impaired flexibility, impaired UE functional use, postural dysfunction, and pain.   ACTIVITY LIMITATIONS: carrying, lifting, standing, stairs, transfers, and locomotion level  PARTICIPATION LIMITATIONS: driving, shopping, and community  activity  PERSONAL FACTORS: Age, Past/current experiences, and 3+ comorbidities: (Hx cervical myelopathy, CAD, HLD, HTN, OA) are also affecting patient's functional outcome.   REHAB POTENTIAL: Good  CLINICAL DECISION MAKING: Unstable/unpredictable  EVALUATION COMPLEXITY: High   GOALS: Goals reviewed with patient? Yes  SHORT TERM GOALS: Target date: 11/23/2023  Pt will be independent with HEP to improve strength and movement control to improve balance and function at home and in community. Baseline: 11/02/23: Baseline HEP initiated.  Goal status: INITIAL   LONG TERM GOALS: Target date: 12/29/2023  Pt will improve BERG to 45/56 or greater indicative of decreased fall risk and improved balance Baseline: 11/02/23: 36/56 Goal status: INITIAL  2.  Pt will decrease worst neck pain by at least 2 points on the NPRS in order to demonstrate clinically significant reduction in neck pain. Baseline: 11/02/23: 5/10 pain at worst.  Goal status: INITIAL  3.  Pt will improve ABC Scale by at least 13% or greater indicative of clinically meaningful change in pt's balance confidence and function.      Baseline: 11/02/23: 47.5% Goal status: INITIAL  4.  Pt will improve DGI by at least 3 points in order to demonstrate clinically significant improvement in balance and decreased risk for falls.    Baseline: 11/02/23: DGI to be completed on visit #2.  Goal status: INITIAL  5.  Pt will have MMT 4+/5 or greater for all tested UE musculature (pending clearance from physician for activity escalation/lifting of 10-lb restriction) indicative of improved capacity for lifting and carrying  Baseline: 11/02/23: Current lifting restriction, MMTs 3+ to 4/5. Goal status: INITIAL  6.  Pt will decrease NDI score by at least 19% in order demonstrate clinically significant reduction in neck pain/disability. Baseline: 11/03/23: 28% Goal status: INITIAL  PLAN: PT FREQUENCY: 1-2x/week  PT DURATION: 8 weeks  PLANNED  INTERVENTIONS: Therapeutic exercises, Therapeutic activity, Neuromuscular re-education, Balance training, Gait training, Patient/Family education, Self Care, Joint mobilization, Joint manipulation, Vestibular training, Canalith repositioning, Orthotic/Fit training, DME instructions, Dry Needling, Electrical stimulation, Spinal manipulation, Spinal mobilization, Cryotherapy, Moist heat, Taping, Traction, Ultrasound, Ionotophoresis 4mg /ml Dexamethasone , Manual therapy, and Re-evaluation.  PLAN FOR NEXT SESSION: Continue with weight shifting and dynamic balance drills requiring brief unipedal support. Postural control training. Update ROM/stretching program for C-spine as needed.    Javayah Magaw D Khalis Hittle PT, DPT, GCS  Deepika [place], PT 11/03/2023, 2:02 PM

## 2023-11-08 ENCOUNTER — Ambulatory Visit: Attending: Neurosurgery

## 2023-11-08 DIAGNOSIS — M542 Cervicalgia: Secondary | ICD-10-CM | POA: Insufficient documentation

## 2023-11-08 DIAGNOSIS — Z981 Arthrodesis status: Secondary | ICD-10-CM | POA: Diagnosis present

## 2023-11-08 DIAGNOSIS — R262 Difficulty in walking, not elsewhere classified: Secondary | ICD-10-CM | POA: Insufficient documentation

## 2023-11-08 DIAGNOSIS — M6281 Muscle weakness (generalized): Secondary | ICD-10-CM | POA: Diagnosis present

## 2023-11-08 NOTE — Therapy (Signed)
 OUTPATIENT PHYSICAL THERAPY POST-OP TREATMENT (CERVICAL FUSION)   Patient Name: Mario Wilson MRN: [id number] DOB:04/03/1950, 74 y.o., male Today's Date: 11/01/2023  END OF SESSION:  PT End of Session - 11/08/23 1440     Visit Number 3    Number of Visits 21    Date for PT Re-Evaluation 01/10/24    Authorization Type eval: 11/01/23    PT Start Time 1440    PT Stop Time 1528    PT Time Calculation (min) 48 min    Equipment Utilized During Treatment Gait belt    Activity Tolerance Patient tolerated treatment well;Patient limited by fatigue    Behavior During Therapy WFL for tasks assessed/performed          Past Medical History:  Diagnosis Date   Bifascicular block (RBBB + LAFB)    Cervical myelopathy (HCC)    Cervical radiculopathy    Cervical stenosis (uterine cervix)    Coronary artery disease    stents x 2 placed in [place]  in approximately 2014-2015   ED (erectile dysfunction)    Hyperlipidemia    Hypertension    Hypogonadism in male    Osteoarthritis    Past Surgical History:  Procedure Laterality Date   ANTERIOR CERVICAL DECOMP/DISCECTOMY FUSION N/A 09/16/2023   Procedure: ANTERIOR CERVICAL DECOMPRESSION/DISCECTOMY FUSION 2 LEVELS;  Surgeon: Clois Fret, MD;  Location: [facility];  Service: Neurosurgery;  Laterality: N/A;  C3-5 ANTERIOR CERVICAL DISCECTOMY AND FUSION   CARDIAC CATHETERIZATION  2015   stent   COLONOSCOPY     CORONARY ANGIOPLASTY     EXCISION MORTON'S NEUROMA Left 07/04/2019   Procedure: EXCISION MORTON'S NEUROMA LEFT TOE;  Surgeon: Ashley Soulier, DPM;  Location: [facility];  Service: Podiatry;  Laterality: Left;  LOCAL WITH IV   EXCISION MORTON'S NEUROMA Left 08/06/2020   Procedure: EXCISION MORTON'S NEUROMA- LEFT FT X2;  Surgeon: Ashley Soulier, DPM;  Location: [facility];  Service: Podiatry;  Laterality: Left;  GENERAL WITH LOCAL   KNEE ARTHROPLASTY Left 08/20/2019   Procedure: COMPUTER ASSISTED TOTAL KNEE ARTHROPLASTY;   Surgeon: Mardee Lynwood SQUIBB, MD;  Location: [facility];  Service: Orthopedics;  Laterality: Left;   KNEE ARTHROPLASTY Right 11/12/2020   Procedure: COMPUTER ASSISTED TOTAL KNEE ARTHROPLASTY;  Surgeon: Mardee Lynwood SQUIBB, MD;  Location: [facility];  Service: Orthopedics;  Laterality: Right;   TOTAL HIP ARTHROPLASTY Left 02/2014   Patient Active Problem List   Diagnosis Date Noted   Cervical myelopathy (HCC) 09/16/2023   Cervical radiculopathy 09/16/2023   Spinal stenosis in cervical region 09/16/2023   Total knee replacement status 11/12/2020   Status post total left knee replacement 08/20/2019   History of coronary artery stent placement 08/16/2019   Severe obesity (BMI 35.0-39.9) with comorbidity (HCC) 08/16/2019   Primary osteoarthritis of right knee 09/03/2018   2-vessel coronary artery disease 02/05/2015   Erectile dysfunction 02/05/2015   Essential hypertension 02/05/2015   Hx of hypogonadism 02/05/2015   Mixed hyperlipidemia 02/05/2015    PCP: Trinidad Cassondra BROCKS, MD  REFERRING PROVIDER: Clois Fret, MD  REFERRING DIAG:  G95.9 (ICD-10-CM) - Cervical myelopathy (HCC)  Z98.1 (ICD-10-CM) - S/P cervical spinal fusion   RATIONALE FOR EVALUATION AND TREATMENT: Rehabilitation  THERAPY DIAG: Muscle weakness (generalized)  Difficulty in walking, not elsewhere classified  Cervicalgia  ONSET DATE: S/p ACDF C3-5 for myelopathy (DOS: 09/16/23)  FOLLOW-UP APPT SCHEDULED WITH REFERRING PROVIDER: Yes ; F/u late next month   FROM INITIAL EVALUATION SUBJECTIVE:  Chief Complaint: Pt is a 73 year old male s/p ACDF C3-5 09/16/23 for cervical myelopathy; pt currently referred to PT for neck, R shoulder, imbalance.   Pertinent History Pt is a 73 year old male s/p ACDF C3-5 09/16/23 for cervical myelopathy; pt  currently referred to PT for neck, R shoulder, imbalance.   Pt had notable difficulty with pain control early post-operatively. He feels that his current pain is manageable - largely localized to R paracervical region/medial R upper trapezius. He states he has exercises from Dr. Clois given for neck - no specific exercises named. Patient is more concerned with gait/balance and ability to get around and has not been given specific exercises for this. He reports challenge with functional ER with R upper limb today; he has functional forward elevation of bilat shoulders. Pt still under 10-lb lifting restriction to his knowledge.   Pt reports primary concern of unsteadiness. No recent falls. He reports numbness in toes of either foot.   Pain:  Pain Intensity: Present: 4-5/10, Best: 0/10, Worst: 5/10 Pain location: R paracervical/medial R upper trap  Pain Quality: steady ache Radiating: No  Numbness/Tingling: Yes; numbness in toes, none in upper limbs per pt Focal Weakness: Yes; lifting toes Aggravating factors: activity throuhgout day  Relieving factors: lying down  24-hour pain behavior: worse as day goes on  History of prior neck injury, pain, surgery, or therapy: Yes; Hx of bilat TKA, cervical fusion C3-5 09/16/23, L THA Dominant hand: right Imaging: Yes; post-op follow-up imaging, no comment from MD per pt on any hardware concerns   Red flags (personal history of cancer, h/o spinal tumors, history of compression fracture, chills/fever, night sweats, nausea, vomiting, unrelenting pain): Negative  PRECAUTIONS: Other: Lifting precaution - up to 10 lbs     WEIGHT BEARING RESTRICTIONS: No  FALLS: Has patient fallen in last 6 months? 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  Recent memory is intact.  Remote memory is intact.  Attention span and concentration are intact.  Expressive speech is intact.  Patient's fund of knowledge is within normal limits for educational level.    Gross Musculoskeletal Assessment Tremor: None Bulk: Normal Tone: Normal  Gait Poor eccentric control of dorsiflexors bilat form heel strike to foot flat; dec single-limb support time, dec arm swing  Posture Forward head, guarded posture   AROM AROM (Normal range in degrees) AROM 10/31/23  Cervical  Flexion (50) 32  Extension (80) 28  Right lateral flexion (45) 25  Left lateral flexion (45) 20  Right rotation (85) 47  Left rotation (85) 42  (* = pain; Blank rows = not tested)  Shoulder forward elevation AROM WNL Functional ER: R significant motion loss, L WFL  MMT/upper limb quick motor screen MMT (out of 5) Right 10/31/23 Left 10/31/23      Shoulder   Flexion 4- 4-  Abduction 4- 4-  Internal rotation    External rotation  3+ 4  Horizontal abduction    Horizontal adduction    Lower Trapezius    Rhomboids  Elbow  Flexion 4- 4-  Extension 4- 4-  Pronation    Supination        Wrist  Flexion    Extension 4 4  Radial deviation    Ulnar deviation    (* = pain; Blank rows = not tested)  R/L 4/4 Hip flexion 5/5 Hip abduction (seated) 5/5 Hip adduction (seated) 5/5 Knee extension 5/5 Knee flexion 4-/4- Ankle Dorsiflexion 5/5 Ankle Plantarflexion *indicates pain  Sensation Grossly intact to light touch bilateral UE/LE as determined by testing dermatomes C2-L5. Proprioception and hot/cold testing deferred on this date. Subjective  numbness in toes; light touch remains intact.   Reflexes Deferred  Palpation Deferred   SPECIAL TESTS Deferred   FUNCTIONAL TESTING TUG: 12.2 sec 5TSTS: 10 sec  BERG: 36/56 (see flowsheet)    NDI: 28% DGI: 19/24 mCTSIB: conditions 1-2: 30s, condition 3: 5s; condition 4: 3s;    TODAY'S TREATMENT: 11/08/23   SUBJECTIVE: Pt reports that he is doing fine today. No changes since the last therapy session. He reports 5/10 neck stiffness and pain upon arrival. Pt. reports feeling unsteady with most household/community activities over the weekend.   PAIN: 5/10 neck pain   Therapeutic Activity NuStep (seated and arms 12) L1-4 x 10 minutes for BLE strengthening and cardiovascular challenge during interval history;   Standing hip strengthening with 5# ankle weights:  Hip abduction 2 x 12 BLE;  Hip extension 2 x 12 BLE;  Seated LAQ with 5# ankle weights x 10 BLE; Standing SLS in // bars 3x bilaterally to failure  Forward and lateral step ups, 2x12 each  Lateral walking over 6-inch hurdles in // bars, 3x down and back  Lateral walking on foam pad in // bars, 4x down and back   Neuromuscular Re-education  Semitandem balance alternating forward LE x 60s each; Semitandem balance alternating forward LE with horizontal and vertical head turns x 60s each; Tandem gait in // bars (difficult for patient to maintain narrow BOS without use of UEs); Forward ambulation with marches, 4x down and back   Not Performed : Hip flexion marches x 10 BLE; HS curls x 10 BLE;   PATIENT EDUCATION:  Education details: Pt educated throughout session about proper posture and technique with exercises. Improved exercise technique, movement at target joints, use of target muscles after min to mod verbal, visual, tactile cues.  Person educated: Patient Education method: Medical illustrator Education comprehension: verbalized understanding and returned demonstration   HOME EXERCISE  PROGRAM:  Access Code: [email or web address] URL: [email or web address] Date: 11/02/2023 Prepared by: Venetia Endo  Exercises - Seated Heel Toe Raises  - 2 x daily - 7 x weekly - 2 sets - 10 reps - Sit to Stand  - 2 x daily - 7 x weekly - 2 sets - 10 reps - Standing Toe Taps  - 2 x daily - 7 x weekly - 2 sets - 10 reps   ASSESSMENT:  CLINICAL IMPRESSION: Focus of today's session was to increase strength/endurance of bilateral LE's while continuing to implement static and dynamic balance exercises. Pt. Introduced to lateral walking on foam pad, lateral walking over 6-inch hurdles, and lateral step ups to increase endurance capacity of bilateral hip musculature. Pt. was also introduced to SLS on firm surface and was unable to fully take away bilateral UE support due to FOF. Pt. Required CGA for all dynamic and standing balance activities along with long duration seated rest breaks to recover from bilateral LE fatigue and SOB. Pt required verbal  cueing to remain fully upright and looking forward during semitandem static balance and standing hip abduction/extension exercises which he was able to correct. Pt. did report feeling bilateral LE fatigue and soreness at end of tx. Session but no increase in pain. Will continue to monitor symptoms and progress as able.    OBJECTIVE IMPAIRMENTS: Abnormal gait, decreased balance, difficulty walking, decreased ROM, decreased strength, hypomobility, impaired flexibility, impaired UE functional use, postural dysfunction, and pain.   ACTIVITY LIMITATIONS: carrying, lifting, standing, stairs, transfers, and locomotion level  PARTICIPATION LIMITATIONS: driving, shopping, and community activity  PERSONAL FACTORS: Age, Past/current experiences, and 3+ comorbidities: (Hx cervical myelopathy, CAD, HLD, HTN, OA) are also affecting patient's functional outcome.   REHAB POTENTIAL: Good  CLINICAL DECISION MAKING: Unstable/unpredictable  EVALUATION  COMPLEXITY: High   GOALS: Goals reviewed with patient? Yes  SHORT TERM GOALS: Target date: 11/23/2023  Pt will be independent with HEP to improve strength and movement control to improve balance and function at home and in community. Baseline: 11/02/23: Baseline HEP initiated.  Goal status: INITIAL   LONG TERM GOALS: Target date: 12/29/2023  Pt will improve BERG to 45/56 or greater indicative of decreased fall risk and improved balance Baseline: 11/02/23: 36/56 Goal status: INITIAL  2.  Pt will decrease worst neck pain by at least 2 points on the NPRS in order to demonstrate clinically significant reduction in neck pain. Baseline: 11/02/23: 5/10 pain at worst.  Goal status: INITIAL  3.  Pt will improve ABC Scale by at least 13% or greater indicative of clinically meaningful change in pt's balance confidence and function.      Baseline: 11/02/23: 47.5% Goal status: INITIAL  4.  Pt will improve DGI by at least 3 points in order to demonstrate clinically significant improvement in balance and decreased risk for falls.    Baseline: 11/02/23: DGI to be completed on visit #2.  Goal status: INITIAL  5.  Pt will have MMT 4+/5 or greater for all tested UE musculature (pending clearance from physician for activity escalation/lifting of 10-lb restriction) indicative of improved capacity for lifting and carrying  Baseline: 11/02/23: Current lifting restriction, MMTs 3+ to 4/5. Goal status: INITIAL  6.  Pt will decrease NDI score by at least 19% in order demonstrate clinically significant reduction in neck pain/disability. Baseline: 11/03/23: 28% Goal status: INITIAL  PLAN: PT FREQUENCY: 1-2x/week  PT DURATION: 8 weeks  PLANNED INTERVENTIONS: Therapeutic exercises, Therapeutic activity, Neuromuscular re-education, Balance training, Gait training, Patient/Family education, Self Care, Joint mobilization, Joint manipulation, Vestibular training, Canalith repositioning, Orthotic/Fit training, DME  instructions, Dry Needling, Electrical stimulation, Spinal manipulation, Spinal mobilization, Cryotherapy, Moist heat, Taping, Traction, Ultrasound, Ionotophoresis 4mg /ml Dexamethasone , Manual therapy, and Re-evaluation.  PLAN FOR NEXT SESSION: Continue with dynamic balance activities (introduce activities with reaching outside BOS) and general strengthening exercises to improve endurance.   Curtistine Bracket SPT Selinda BIRCH Wilson PT, DPT, GCS  Wilson,Mario, PT 11/08/2023, 5:52 PM

## 2023-11-09 NOTE — Therapy (Incomplete)
 OUTPATIENT PHYSICAL THERAPY POST-OP TREATMENT (CERVICAL FUSION)   Patient Name: Mario Wilson MRN: [id number] DOB:1950-09-14, 73 y.o., male Today's Date: 11/01/2023  END OF SESSION:    Past Medical History:  Diagnosis Date   Bifascicular block (RBBB + LAFB)    Cervical myelopathy (HCC)    Cervical radiculopathy    Cervical stenosis (uterine cervix)    Coronary artery disease    stents x 2 placed in [place]  in approximately 2014-2015   ED (erectile dysfunction)    Hyperlipidemia    Hypertension    Hypogonadism in male    Osteoarthritis    Past Surgical History:  Procedure Laterality Date   ANTERIOR CERVICAL DECOMP/DISCECTOMY FUSION N/A 09/16/2023   Procedure: ANTERIOR CERVICAL DECOMPRESSION/DISCECTOMY FUSION 2 LEVELS;  Surgeon: Clois Fret, MD;  Location: [facility];  Service: Neurosurgery;  Laterality: N/A;  C3-5 ANTERIOR CERVICAL DISCECTOMY AND FUSION   CARDIAC CATHETERIZATION  2015   stent   COLONOSCOPY     CORONARY ANGIOPLASTY     EXCISION MORTON'S NEUROMA Left 07/04/2019   Procedure: EXCISION MORTON'S NEUROMA LEFT TOE;  Surgeon: Ashley Soulier, DPM;  Location: [facility];  Service: Podiatry;  Laterality: Left;  LOCAL WITH IV   EXCISION MORTON'S NEUROMA Left 08/06/2020   Procedure: EXCISION MORTON'S NEUROMA- LEFT FT X2;  Surgeon: Ashley Soulier, DPM;  Location: [facility];  Service: Podiatry;  Laterality: Left;  GENERAL WITH LOCAL   KNEE ARTHROPLASTY Left 08/20/2019   Procedure: COMPUTER ASSISTED TOTAL KNEE ARTHROPLASTY;  Surgeon: Mardee Lynwood SQUIBB, MD;  Location: [facility];  Service: Orthopedics;  Laterality: Left;   KNEE ARTHROPLASTY Right 11/12/2020   Procedure: COMPUTER ASSISTED TOTAL KNEE ARTHROPLASTY;  Surgeon: Mardee Lynwood SQUIBB, MD;  Location: [facility];  Service: Orthopedics;  Laterality: Right;   TOTAL HIP ARTHROPLASTY Left 02/2014   Patient Active Problem List   Diagnosis Date Noted   Cervical myelopathy (HCC) 09/16/2023   Cervical  radiculopathy 09/16/2023   Spinal stenosis in cervical region 09/16/2023   Total knee replacement status 11/12/2020   Status post total left knee replacement 08/20/2019   History of coronary artery stent placement 08/16/2019   Severe obesity (BMI 35.0-39.9) with comorbidity (HCC) 08/16/2019   Primary osteoarthritis of right knee 09/03/2018   2-vessel coronary artery disease 02/05/2015   Erectile dysfunction 02/05/2015   Essential hypertension 02/05/2015   Hx of hypogonadism 02/05/2015   Mixed hyperlipidemia 02/05/2015    PCP: Trinidad Cassondra BROCKS, MD  REFERRING PROVIDER: Clois Fret, MD  REFERRING DIAG:  G95.9 (ICD-10-CM) - Cervical myelopathy (HCC)  Z98.1 (ICD-10-CM) - S/P cervical spinal fusion   RATIONALE FOR EVALUATION AND TREATMENT: Rehabilitation  THERAPY DIAG: Muscle weakness (generalized)  Difficulty in walking, not elsewhere classified  Cervicalgia  ONSET DATE: S/p ACDF C3-5 for myelopathy (DOS: 09/16/23)  FOLLOW-UP APPT SCHEDULED WITH REFERRING PROVIDER: Yes ; F/u late next month   FROM INITIAL EVALUATION SUBJECTIVE:  Chief Complaint: Pt is a 73 year old male s/p ACDF C3-5 09/16/23 for cervical myelopathy; pt currently referred to PT for neck, R shoulder, imbalance.   Pertinent History Pt is a 73 year old male s/p ACDF C3-5 09/16/23 for cervical myelopathy; pt currently referred to PT for neck, R shoulder, imbalance.   Pt had notable difficulty with pain control early post-operatively. He feels that his current pain is manageable - largely localized to R paracervical region/medial R upper trapezius. He states he has exercises from Dr. Clois given for neck - no specific exercises named. Patient is more concerned with gait/balance and ability to get around and has not been given  specific exercises for this. He reports challenge with functional ER with R upper limb today; he has functional forward elevation of bilat shoulders. Pt still under 10-lb lifting restriction to his knowledge.   Pt reports primary concern of unsteadiness. No recent falls. He reports numbness in toes of either foot.   Pain:  Pain Intensity: Present: 4-5/10, Best: 0/10, Worst: 5/10 Pain location: R paracervical/medial R upper trap  Pain Quality: steady ache Radiating: No  Numbness/Tingling: Yes; numbness in toes, none in upper limbs per pt Focal Weakness: Yes; lifting toes Aggravating factors: activity throuhgout day  Relieving factors: lying down  24-hour pain behavior: worse as day goes on  History of prior neck injury, pain, surgery, or therapy: Yes; Hx of bilat TKA, cervical fusion C3-5 09/16/23, L THA Dominant hand: right Imaging: Yes; post-op follow-up imaging, no comment from MD per pt on any hardware concerns   Red flags (personal history of cancer, h/o spinal tumors, history of compression fracture, chills/fever, night sweats, nausea, vomiting, unrelenting pain): Negative  PRECAUTIONS: Other: Lifting precaution - up to 10 lbs     WEIGHT BEARING RESTRICTIONS: No  FALLS: Has patient fallen in last 6 months? 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  Recent memory is intact.  Remote memory is intact.  Attention span and concentration are intact.   Expressive speech is intact.  Patient's fund of knowledge is within normal limits for educational level.    Gross Musculoskeletal Assessment Tremor: None Bulk: Normal Tone: Normal  Gait Poor eccentric control of dorsiflexors bilat form heel strike to foot flat; dec single-limb support time, dec arm swing  Posture Forward head, guarded posture   AROM AROM (Normal range in degrees) AROM 10/31/23  Cervical  Flexion (50) 32  Extension (80) 28  Right lateral flexion (45) 25  Left lateral flexion (45) 20  Right rotation (85) 47  Left rotation (85) 42  (* = pain; Blank rows = not tested)  Shoulder forward elevation AROM WNL Functional ER: R significant motion loss, L WFL  MMT/upper limb quick motor screen MMT (out of 5) Right 10/31/23 Left 10/31/23      Shoulder   Flexion 4- 4-  Abduction 4- 4-  Internal rotation    External rotation  3+ 4  Horizontal abduction    Horizontal adduction    Lower Trapezius    Rhomboids  Elbow  Flexion 4- 4-  Extension 4- 4-  Pronation    Supination        Wrist  Flexion    Extension 4 4  Radial deviation    Ulnar deviation    (* = pain; Blank rows = not tested)  R/L 4/4 Hip flexion 5/5 Hip abduction (seated) 5/5 Hip adduction (seated) 5/5 Knee extension 5/5 Knee flexion 4-/4- Ankle Dorsiflexion 5/5 Ankle Plantarflexion *indicates pain  Sensation Grossly intact to light touch bilateral UE/LE as determined by testing dermatomes C2-L5. Proprioception and hot/cold testing deferred on this date. Subjective numbness in toes; light touch remains intact.   Reflexes Deferred  Palpation Deferred   SPECIAL TESTS Deferred   FUNCTIONAL TESTING TUG: 12.2 sec 5TSTS: 10 sec  BERG: 36/56 (see flowsheet)    NDI: 28% DGI: 19/24 mCTSIB: conditions 1-2: 30s, condition 3: 5s; condition 4: 3s;    TODAY'S TREATMENT: 11/08/23   SUBJECTIVE: Pt reports that he is doing fine today. No changes since the last therapy  session. He reports 5/10 neck stiffness and pain upon arrival. Pt. reports feeling unsteady with most household/community activities over the weekend.   PAIN: 5/10 neck pain   Therapeutic Activity NuStep (seated and arms 12) L1-4 x 10 minutes for BLE strengthening and cardiovascular challenge during interval history;   Standing hip strengthening with 5# ankle weights:  Hip abduction 2 x 12 BLE;  Hip extension 2 x 12 BLE;  Seated LAQ with 5# ankle weights x 10 BLE; Standing SLS in // bars 3x bilaterally to failure  Forward and lateral step ups, 2x12 each  Lateral walking over 6-inch hurdles in // bars, 3x down and back  Lateral walking on foam pad in // bars, 4x down and back   Neuromuscular Re-education  Semitandem balance alternating forward LE x 60s each; Semitandem balance alternating forward LE with horizontal and vertical head turns x 60s each; Tandem gait in // bars (difficult for patient to maintain narrow BOS without use of UEs); Forward ambulation with marches, 4x down and back   Not Performed : Hip flexion marches x 10 BLE; HS curls x 10 BLE;   PATIENT EDUCATION:  Education details: Pt educated throughout session about proper posture and technique with exercises. Improved exercise technique, movement at target joints, use of target muscles after min to mod verbal, visual, tactile cues.  Person educated: Patient Education method: Medical illustrator Education comprehension: verbalized understanding and returned demonstration   HOME EXERCISE PROGRAM:  Access Code: [email or web address] URL: [email or web address] Date: 11/02/2023 Prepared by: Venetia Endo  Exercises - Seated Heel Toe Raises  - 2 x daily - 7 x weekly - 2 sets - 10 reps - Sit to Stand  - 2 x daily - 7 x weekly - 2 sets - 10 reps - Standing Toe Taps  - 2 x daily - 7 x weekly - 2 sets - 10 reps   ASSESSMENT:  CLINICAL IMPRESSION: Focus of today's session was to increase  strength/endurance of bilateral LE's while continuing to implement static and dynamic balance exercises. Pt. Introduced to lateral walking on foam pad, lateral walking over 6-inch hurdles, and lateral step ups to increase endurance capacity of bilateral hip musculature. Pt. was also introduced to SLS on firm surface and was unable to fully take away bilateral UE support due to FOF. Pt. Required CGA for all dynamic and standing balance activities along with long duration seated rest breaks to recover from bilateral LE fatigue and SOB. Pt required verbal  cueing to remain fully upright and looking forward during semitandem static balance and standing hip abduction/extension exercises which he was able to correct. Pt. did report feeling bilateral LE fatigue and soreness at end of tx. Session but no increase in pain. Will continue to monitor symptoms and progress as able.    OBJECTIVE IMPAIRMENTS: Abnormal gait, decreased balance, difficulty walking, decreased ROM, decreased strength, hypomobility, impaired flexibility, impaired UE functional use, postural dysfunction, and pain.   ACTIVITY LIMITATIONS: carrying, lifting, standing, stairs, transfers, and locomotion level  PARTICIPATION LIMITATIONS: driving, shopping, and community activity  PERSONAL FACTORS: Age, Past/current experiences, and 3+ comorbidities: (Hx cervical myelopathy, CAD, HLD, HTN, OA) are also affecting patient's functional outcome.   REHAB POTENTIAL: Good  CLINICAL DECISION MAKING: Unstable/unpredictable  EVALUATION COMPLEXITY: High   GOALS: Goals reviewed with patient? Yes  SHORT TERM GOALS: Target date: 11/23/2023  Pt will be independent with HEP to improve strength and movement control to improve balance and function at home and in community. Baseline: 11/02/23: Baseline HEP initiated.  Goal status: INITIAL   LONG TERM GOALS: Target date: 12/29/2023  Pt will improve BERG to 45/56 or greater indicative of decreased fall  risk and improved balance Baseline: 11/02/23: 36/56 Goal status: INITIAL  2.  Pt will decrease worst neck pain by at least 2 points on the NPRS in order to demonstrate clinically significant reduction in neck pain. Baseline: 11/02/23: 5/10 pain at worst.  Goal status: INITIAL  3.  Pt will improve ABC Scale by at least 13% or greater indicative of clinically meaningful change in pt's balance confidence and function.      Baseline: 11/02/23: 47.5% Goal status: INITIAL  4.  Pt will improve DGI by at least 3 points in order to demonstrate clinically significant improvement in balance and decreased risk for falls.    Baseline: 11/02/23: DGI to be completed on visit #2.  Goal status: INITIAL  5.  Pt will have MMT 4+/5 or greater for all tested UE musculature (pending clearance from physician for activity escalation/lifting of 10-lb restriction) indicative of improved capacity for lifting and carrying  Baseline: 11/02/23: Current lifting restriction, MMTs 3+ to 4/5. Goal status: INITIAL  6.  Pt will decrease NDI score by at least 19% in order demonstrate clinically significant reduction in neck pain/disability. Baseline: 11/03/23: 28% Goal status: INITIAL  PLAN: PT FREQUENCY: 1-2x/week  PT DURATION: 8 weeks  PLANNED INTERVENTIONS: Therapeutic exercises, Therapeutic activity, Neuromuscular re-education, Balance training, Gait training, Patient/Family education, Self Care, Joint mobilization, Joint manipulation, Vestibular training, Canalith repositioning, Orthotic/Fit training, DME instructions, Dry Needling, Electrical stimulation, Spinal manipulation, Spinal mobilization, Cryotherapy, Moist heat, Taping, Traction, Ultrasound, Ionotophoresis 4mg /ml Dexamethasone , Manual therapy, and Re-evaluation.  PLAN FOR NEXT SESSION: Continue with dynamic balance activities (introduce activities with reaching outside BOS) and general strengthening exercises to improve endurance.   Curtistine Bracket SPT Selinda BIRCH  Rielyn Krupinski PT, DPT, GCS  Navika Hoopes, PT 11/09/2023, 12:55 PM

## 2023-11-10 ENCOUNTER — Ambulatory Visit

## 2023-11-15 ENCOUNTER — Ambulatory Visit: Admitting: Physical Therapy

## 2023-11-15 ENCOUNTER — Encounter: Payer: Self-pay | Admitting: Physical Therapy

## 2023-11-15 DIAGNOSIS — Z981 Arthrodesis status: Secondary | ICD-10-CM

## 2023-11-15 DIAGNOSIS — R262 Difficulty in walking, not elsewhere classified: Secondary | ICD-10-CM

## 2023-11-15 DIAGNOSIS — M6281 Muscle weakness (generalized): Secondary | ICD-10-CM

## 2023-11-15 DIAGNOSIS — M542 Cervicalgia: Secondary | ICD-10-CM

## 2023-11-15 NOTE — Therapy (Signed)
 OUTPATIENT PHYSICAL THERAPY POST-OP TREATMENT (CERVICAL FUSION)   Patient Name: Mario Wilson MRN: [id number] DOB:05-Sep-1950, 73 y.o., male Today's Date: 11/15/2023  END OF SESSION:  PT End of Session - 11/15/23 0943     Visit Number 4    Number of Visits 21    Date for PT Re-Evaluation 01/10/24    Authorization Type eval: 11/01/23    PT Start Time 0946    PT Stop Time 1035    PT Time Calculation (min) 49 min    Equipment Utilized During Treatment Gait belt    Activity Tolerance Patient tolerated treatment well;Patient limited by fatigue    Behavior During Therapy WFL for tasks assessed/performed           Past Medical History:  Diagnosis Date   Bifascicular block (RBBB + LAFB)    Cervical myelopathy (HCC)    Cervical radiculopathy    Cervical stenosis (uterine cervix)    Coronary artery disease    stents x 2 placed in [place]  in approximately 2014-2015   ED (erectile dysfunction)    Hyperlipidemia    Hypertension    Hypogonadism in male    Osteoarthritis    Past Surgical History:  Procedure Laterality Date   ANTERIOR CERVICAL DECOMP/DISCECTOMY FUSION N/A 09/16/2023   Procedure: ANTERIOR CERVICAL DECOMPRESSION/DISCECTOMY FUSION 2 LEVELS;  Surgeon: Clois Fret, MD;  Location: [facility];  Service: Neurosurgery;  Laterality: N/A;  C3-5 ANTERIOR CERVICAL DISCECTOMY AND FUSION   CARDIAC CATHETERIZATION  2015   stent   COLONOSCOPY     CORONARY ANGIOPLASTY     EXCISION MORTON'S NEUROMA Left 07/04/2019   Procedure: EXCISION MORTON'S NEUROMA LEFT TOE;  Surgeon: Ashley Soulier, DPM;  Location: [facility];  Service: Podiatry;  Laterality: Left;  LOCAL WITH IV   EXCISION MORTON'S NEUROMA Left 08/06/2020   Procedure: EXCISION MORTON'S NEUROMA- LEFT FT X2;  Surgeon: Ashley Soulier, DPM;  Location: [facility];  Service: Podiatry;  Laterality: Left;  GENERAL WITH LOCAL   KNEE ARTHROPLASTY Left 08/20/2019   Procedure: COMPUTER ASSISTED TOTAL KNEE ARTHROPLASTY;   Surgeon: Mardee Lynwood SQUIBB, MD;  Location: [facility];  Service: Orthopedics;  Laterality: Left;   KNEE ARTHROPLASTY Right 11/12/2020   Procedure: COMPUTER ASSISTED TOTAL KNEE ARTHROPLASTY;  Surgeon: Mardee Lynwood SQUIBB, MD;  Location: [facility];  Service: Orthopedics;  Laterality: Right;   TOTAL HIP ARTHROPLASTY Left 02/2014   Patient Active Problem List   Diagnosis Date Noted   Cervical myelopathy (HCC) 09/16/2023   Cervical radiculopathy 09/16/2023   Spinal stenosis in cervical region 09/16/2023   Total knee replacement status 11/12/2020   Status post total left knee replacement 08/20/2019   History of coronary artery stent placement 08/16/2019   Severe obesity (BMI 35.0-39.9) with comorbidity (HCC) 08/16/2019   Primary osteoarthritis of right knee 09/03/2018   2-vessel coronary artery disease 02/05/2015   Erectile dysfunction 02/05/2015   Essential hypertension 02/05/2015   Hx of hypogonadism 02/05/2015   Mixed hyperlipidemia 02/05/2015    PCP: Trinidad Cassondra BROCKS, MD  REFERRING PROVIDER: Clois Fret, MD  REFERRING DIAG:  G95.9 (ICD-10-CM) - Cervical myelopathy (HCC)  Z98.1 (ICD-10-CM) - S/P cervical spinal fusion   RATIONALE FOR EVALUATION AND TREATMENT: Rehabilitation  THERAPY DIAG: Muscle weakness (generalized)  Difficulty in walking, not elsewhere classified  Cervicalgia  S/P cervical spinal fusion  ONSET DATE: S/p ACDF C3-5 for myelopathy (DOS: 09/16/23)  FOLLOW-UP APPT SCHEDULED WITH REFERRING PROVIDER: Yes ; F/u late next month   FROM INITIAL EVALUATION SUBJECTIVE:  Chief Complaint: Pt is a 73 year old male s/p ACDF C3-5 09/16/23 for cervical myelopathy; pt currently referred to PT for neck, R shoulder, imbalance.   Pertinent History Pt is a 73 year old male s/p ACDF C3-5 09/16/23  for cervical myelopathy; pt currently referred to PT for neck, R shoulder, imbalance.   Pt had notable difficulty with pain control early post-operatively. He feels that his current pain is manageable - largely localized to R paracervical region/medial R upper trapezius. He states he has exercises from Dr. Clois given for neck - no specific exercises named. Patient is more concerned with gait/balance and ability to get around and has not been given specific exercises for this. He reports challenge with functional ER with R upper limb today; he has functional forward elevation of bilat shoulders. Pt still under 10-lb lifting restriction to his knowledge.   Pt reports primary concern of unsteadiness. No recent falls. He reports numbness in toes of either foot.   Pain:  Pain Intensity: Present: 4-5/10, Best: 0/10, Worst: 5/10 Pain location: R paracervical/medial R upper trap  Pain Quality: steady ache Radiating: No  Numbness/Tingling: Yes; numbness in toes, none in upper limbs per pt Focal Weakness: Yes; lifting toes Aggravating factors: activity throuhgout day  Relieving factors: lying down  24-hour pain behavior: worse as day goes on  History of prior neck injury, pain, surgery, or therapy: Yes; Hx of bilat TKA, cervical fusion C3-5 09/16/23, L THA Dominant hand: right Imaging: Yes; post-op follow-up imaging, no comment from MD per pt on any hardware concerns   Red flags (personal history of cancer, h/o spinal tumors, history of compression fracture, chills/fever, night sweats, nausea, vomiting, unrelenting pain): Negative  PRECAUTIONS: Other: Lifting precaution - up to 10 lbs     WEIGHT BEARING RESTRICTIONS: No  FALLS: Has patient fallen in last 6 months? 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  Recent memory is intact.  Remote memory is intact.  Attention span and concentration are intact.  Expressive speech is intact.  Patient's fund of knowledge is within normal limits for educational level.    Gross Musculoskeletal Assessment Tremor: None Bulk: Normal Tone: Normal  Gait Poor eccentric control of dorsiflexors bilat form heel strike to foot flat; dec single-limb support time, dec arm swing  Posture Forward head, guarded posture   AROM AROM (Normal range in degrees) AROM 10/31/23  Cervical  Flexion (50) 32  Extension (80) 28  Right lateral flexion (45) 25  Left lateral flexion (45) 20  Right rotation (85) 47  Left rotation (85) 42  (* = pain; Blank rows = not tested)  Shoulder forward elevation AROM WNL Functional ER: R significant motion loss, L WFL  MMT/upper limb quick motor screen MMT (out of 5) Right 10/31/23 Left 10/31/23      Shoulder   Flexion 4- 4-  Abduction 4- 4-  Internal rotation    External rotation  3+ 4  Horizontal abduction    Horizontal adduction    Lower Trapezius    Rhomboids  Elbow  Flexion 4- 4-  Extension 4- 4-  Pronation    Supination        Wrist  Flexion    Extension 4 4  Radial deviation    Ulnar deviation    (* = pain; Blank rows = not tested)  R/L 4/4 Hip flexion 5/5 Hip abduction (seated) 5/5 Hip adduction (seated) 5/5 Knee extension 5/5 Knee flexion 4-/4- Ankle Dorsiflexion 5/5 Ankle Plantarflexion *indicates pain  Sensation Grossly intact to light touch bilateral UE/LE as determined by testing dermatomes C2-L5. Proprioception and hot/cold testing  deferred on this date. Subjective numbness in toes; light touch remains intact.   Reflexes Deferred  Palpation Deferred   SPECIAL TESTS Deferred   FUNCTIONAL TESTING TUG: 12.2 sec 5TSTS: 10 sec  BERG: 36/56 (see flowsheet)    NDI: 28% DGI: 19/24 mCTSIB: conditions 1-2: 30s, condition 3: 5s; condition 4: 3s;    TODAY'S TREATMENT: 11/15/2023   SUBJECTIVE: Pt voices concern related to raising his toes. He reports his neck is not that painful. Patient reports pain intensity is variable. Pt reports not experiencing arm pain as much. Patient reports compliance with his HEP. He is concerned that ability to raise his toes/heels is not returning and feels he may need to communicate with MD again regarding this issue. He is curious whether or not he needs to work on his neck more due to no results obtained from therapy focused on strength/balance/functional mobility yet.   PAIN: 5/10 neck pain   Therapeutic Activity  *not today* Standing SLS in // bars 3x bilaterally to failure  Forward and lateral step ups, 2x12 each  Lateral walking over 6-inch hurdles in // bars, 3x down and back  Lateral walking on foam pad in // bars, 4x down and back Standing hip strengthening with 5# ankle weights:  Hip abduction 2 x 12 BLE;  Hip extension 2 x 12 BLE; Seated LAQ with 5# ankle weights x 10 BLE;   Neuromuscular Re-education   Functional reaching to targets on wall; 6 targets arranged in semicircle, bilateral stance on Airex with single bar in front; 4x D/B semicircle   Semitandem balance alternating forward LE with horizontal and vertical head turns x 30 sec each with RLE/LLE in back Forward ambulation with marches, 4x down and back, along blue agility ladder  -PT CGA   Not Performed : Hip flexion marches x 10 BLE; HS curls x 10 BLE; Tandem gait in // bars (difficult for patient to maintain narrow BOS without use of UEs);   Therapeutic Exercise - improved strength as needed to  improve performance of CKC activities/functional movements and as needed for power production to prevent fall during episode of large postural perturbation   NuStep (seat at 11, arms at 8) L4-5 x 8 minutes for BLE strengthening and cardiovascular challenge during interval history;   Seated resisted plantarflexion with Tband, Black Tband; 3 x 12 each side  Total Gym Heel raise; attempted at Level 14 and Level 10; unable to achieve heel raise from platform at lower level  PATIENT EDUCATION: Discussed with pt expected timeline for recovery of strength/motor activation in setting of neuromotor weakness affecting plantarflexors/dorsiflexors and toe extensors.     PATIENT EDUCATION:  Education details: Pt educated throughout session about proper posture and technique with exercises. Improved exercise technique, movement at target joints, use of target muscles after min to mod verbal, visual, tactile cues.  Person educated: Patient Education method: Medical illustrator Education comprehension: verbalized understanding and returned demonstration  HOME EXERCISE PROGRAM:  Access Code: [email or web address] URL: [email or web address] Date: 11/02/2023 Prepared by: Venetia Endo  Exercises - Seated Heel Toe Raises  - 2 x daily - 7 x weekly - 2 sets - 10 reps - Sit to Stand  - 2 x daily - 7 x weekly - 2 sets - 10 reps - Standing Toe Taps  - 2 x daily - 7 x weekly - 2 sets - 10 reps   ASSESSMENT:  CLINICAL IMPRESSION: Pt has mild to moderate R-sided neck pain and does voice some pain with attempted ROM work. He has expected ROM deficits following multi-level fusion, but fortunately upper quarter pain radiating into arm and paresthesias are not significant at this time. Thus far, PT has focused on patient's primary concerns of gait/balance/motor function of lower extremities in setting of cervical myelopathy treated with ACDF. We updated his strengthening program to include low-impact  plantarflexor strengthening in sitting given limited capacity for completing heel raise/toe raise. We will complete further active dorsiflexor work next visit. Will continue to monitor symptoms and progress as able.    OBJECTIVE IMPAIRMENTS: Abnormal gait, decreased balance, difficulty walking, decreased ROM, decreased strength, hypomobility, impaired flexibility, impaired UE functional use, postural dysfunction, and pain.   ACTIVITY LIMITATIONS: carrying, lifting, standing, stairs, transfers, and locomotion level  PARTICIPATION LIMITATIONS: driving, shopping, and community activity  PERSONAL FACTORS: Age, Past/current experiences, and 3+ comorbidities: (Hx cervical myelopathy, CAD, HLD, HTN, OA) are also affecting patient's functional outcome.   REHAB POTENTIAL: Good  CLINICAL DECISION MAKING: Unstable/unpredictable  EVALUATION COMPLEXITY: High   GOALS: Goals reviewed with patient? Yes  SHORT TERM GOALS: Target date: 11/23/2023  Pt will be independent with HEP to improve strength and movement control to improve balance and function at home and in community. Baseline: 11/02/23: Baseline HEP initiated.  Goal status: INITIAL   LONG TERM GOALS: Target date: 12/29/2023  Pt will improve BERG to 45/56 or greater indicative of decreased fall risk and improved balance Baseline: 11/02/23: 36/56 Goal status: INITIAL  2.  Pt will decrease worst neck pain by at least 2 points on the NPRS in order to demonstrate clinically significant reduction in neck pain. Baseline: 11/02/23: 5/10 pain at worst.  Goal status: INITIAL  3.  Pt will improve ABC Scale by at least 13% or greater indicative of clinically meaningful change in pt's balance confidence and function.      Baseline: 11/02/23: 47.5% Goal status: INITIAL  4.  Pt will improve DGI by at least 3 points in order to demonstrate clinically significant improvement in balance and decreased risk for falls.    Baseline: 11/02/23: DGI to be  completed on visit #2.  Goal status: INITIAL  5.  Pt will have MMT 4+/5 or greater for all tested UE musculature (pending clearance from physician for activity escalation/lifting of 10-lb restriction) indicative of improved capacity for lifting and carrying  Baseline: 11/02/23: Current lifting restriction, MMTs 3+ to 4/5. Goal status: INITIAL  6.  Pt will decrease NDI score by at least 19% in order demonstrate clinically significant reduction in neck pain/disability. Baseline: 11/03/23: 28% Goal status: INITIAL  PLAN: PT FREQUENCY: 1-2x/week  PT DURATION: 8 weeks  PLANNED INTERVENTIONS: Therapeutic exercises, Therapeutic activity, Neuromuscular re-education, Balance training, Gait training, Patient/Family education, Self Care, Joint mobilization, Joint manipulation, Vestibular training, Canalith repositioning, Orthotic/Fit training, DME instructions, Dry Needling, Electrical stimulation, Spinal manipulation, Spinal mobilization, Cryotherapy, Moist heat, Taping, Traction, Ultrasound, Ionotophoresis 4mg /ml Dexamethasone , Manual therapy, and Re-evaluation.  PLAN FOR NEXT SESSION: Continue with  dynamic balance activities (including activities c reaching outside of BOS) and general strengthening exercises to improve endurance. Continue with plantarflexor/dorsiflexor activation and motor control re-training for stepping pattern and obstacle clearance.    Venetia Endo, PT, DPT #[id number]  Venetia ONEIDA Endo, PT 11/15/2023, 11:33 AM

## 2023-11-17 ENCOUNTER — Encounter: Payer: Self-pay | Admitting: Physical Therapy

## 2023-11-17 ENCOUNTER — Ambulatory Visit: Admitting: Physical Therapy

## 2023-11-17 DIAGNOSIS — R262 Difficulty in walking, not elsewhere classified: Secondary | ICD-10-CM

## 2023-11-17 DIAGNOSIS — M6281 Muscle weakness (generalized): Secondary | ICD-10-CM | POA: Diagnosis not present

## 2023-11-17 DIAGNOSIS — Z981 Arthrodesis status: Secondary | ICD-10-CM

## 2023-11-17 DIAGNOSIS — M542 Cervicalgia: Secondary | ICD-10-CM

## 2023-11-17 NOTE — Therapy (Signed)
 OUTPATIENT PHYSICAL THERAPY POST-OP TREATMENT (CERVICAL FUSION)   Patient Name: Mario Wilson MRN: [id number] DOB:Aug 04, 1950, 73 y.o., male Today's Date: 11/17/2023  END OF SESSION:  PT End of Session - 11/17/23 0945     Visit Number 5    Number of Visits 21    Date for PT Re-Evaluation 01/10/24    Authorization Type eval: 11/01/23    PT Start Time 0944    PT Stop Time 1027    PT Time Calculation (min) 43 min    Equipment Utilized During Treatment Gait belt    Activity Tolerance Patient tolerated treatment well;Patient limited by fatigue    Behavior During Therapy WFL for tasks assessed/performed            Past Medical History:  Diagnosis Date   Bifascicular block (RBBB + LAFB)    Cervical myelopathy (HCC)    Cervical radiculopathy    Cervical stenosis (uterine cervix)    Coronary artery disease    stents x 2 placed in [place]  in approximately 2014-2015   ED (erectile dysfunction)    Hyperlipidemia    Hypertension    Hypogonadism in male    Osteoarthritis    Past Surgical History:  Procedure Laterality Date   ANTERIOR CERVICAL DECOMP/DISCECTOMY FUSION N/A 09/16/2023   Procedure: ANTERIOR CERVICAL DECOMPRESSION/DISCECTOMY FUSION 2 LEVELS;  Surgeon: Clois Fret, MD;  Location: [facility];  Service: Neurosurgery;  Laterality: N/A;  C3-5 ANTERIOR CERVICAL DISCECTOMY AND FUSION   CARDIAC CATHETERIZATION  2015   stent   COLONOSCOPY     CORONARY ANGIOPLASTY     EXCISION MORTON'S NEUROMA Left 07/04/2019   Procedure: EXCISION MORTON'S NEUROMA LEFT TOE;  Surgeon: Ashley Soulier, DPM;  Location: [facility];  Service: Podiatry;  Laterality: Left;  LOCAL WITH IV   EXCISION MORTON'S NEUROMA Left 08/06/2020   Procedure: EXCISION MORTON'S NEUROMA- LEFT FT X2;  Surgeon: Ashley Soulier, DPM;  Location: [facility];  Service: Podiatry;  Laterality: Left;  GENERAL WITH LOCAL   KNEE ARTHROPLASTY Left 08/20/2019   Procedure: COMPUTER ASSISTED TOTAL KNEE  ARTHROPLASTY;  Surgeon: Mardee Lynwood SQUIBB, MD;  Location: [facility];  Service: Orthopedics;  Laterality: Left;   KNEE ARTHROPLASTY Right 11/12/2020   Procedure: COMPUTER ASSISTED TOTAL KNEE ARTHROPLASTY;  Surgeon: Mardee Lynwood SQUIBB, MD;  Location: [facility];  Service: Orthopedics;  Laterality: Right;   TOTAL HIP ARTHROPLASTY Left 02/2014   Patient Active Problem List   Diagnosis Date Noted   Cervical myelopathy (HCC) 09/16/2023   Cervical radiculopathy 09/16/2023   Spinal stenosis in cervical region 09/16/2023   Total knee replacement status 11/12/2020   Status post total left knee replacement 08/20/2019   History of coronary artery stent placement 08/16/2019   Severe obesity (BMI 35.0-39.9) with comorbidity (HCC) 08/16/2019   Primary osteoarthritis of right knee 09/03/2018   2-vessel coronary artery disease 02/05/2015   Erectile dysfunction 02/05/2015   Essential hypertension 02/05/2015   Hx of hypogonadism 02/05/2015   Mixed hyperlipidemia 02/05/2015    PCP: Trinidad Cassondra BROCKS, MD  REFERRING PROVIDER: Clois Fret, MD  REFERRING DIAG:  G95.9 (ICD-10-CM) - Cervical myelopathy (HCC)  Z98.1 (ICD-10-CM) - S/P cervical spinal fusion   RATIONALE FOR EVALUATION AND TREATMENT: Rehabilitation  THERAPY DIAG: Muscle weakness (generalized)  Difficulty in walking, not elsewhere classified  Cervicalgia  S/P cervical spinal fusion  ONSET DATE: S/p ACDF C3-5 for myelopathy (DOS: 09/16/23)  FOLLOW-UP APPT SCHEDULED WITH REFERRING PROVIDER: Yes ; F/u late next month   FROM INITIAL EVALUATION SUBJECTIVE:  Chief Complaint: Pt is a 73 year old male s/p ACDF C3-5 09/16/23 for cervical myelopathy; pt currently referred to PT for neck, R shoulder, imbalance.   Pertinent History Pt is a 73 year old male s/p ACDF  C3-5 09/16/23 for cervical myelopathy; pt currently referred to PT for neck, R shoulder, imbalance.   Pt had notable difficulty with pain control early post-operatively. He feels that his current pain is manageable - largely localized to R paracervical region/medial R upper trapezius. He states he has exercises from Dr. Clois given for neck - no specific exercises named. Patient is more concerned with gait/balance and ability to get around and has not been given specific exercises for this. He reports challenge with functional ER with R upper limb today; he has functional forward elevation of bilat shoulders. Pt still under 10-lb lifting restriction to his knowledge.   Pt reports primary concern of unsteadiness. No recent falls. He reports numbness in toes of either foot.   Pain:  Pain Intensity: Present: 4-5/10, Best: 0/10, Worst: 5/10 Pain location: R paracervical/medial R upper trap  Pain Quality: steady ache Radiating: No  Numbness/Tingling: Yes; numbness in toes, none in upper limbs per pt Focal Weakness: Yes; lifting toes Aggravating factors: activity throuhgout day  Relieving factors: lying down  24-hour pain behavior: worse as day goes on  History of prior neck injury, pain, surgery, or therapy: Yes; Hx of bilat TKA, cervical fusion C3-5 09/16/23, L THA Dominant hand: right Imaging: Yes; post-op follow-up imaging, no comment from MD per pt on any hardware concerns   Red flags (personal history of cancer, h/o spinal tumors, history of compression fracture, chills/fever, night sweats, nausea, vomiting, unrelenting pain): Negative  PRECAUTIONS: Other: Lifting precaution - up to 10 lbs     WEIGHT BEARING RESTRICTIONS: No  FALLS: Has patient fallen in last 6 months? 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  Recent memory is intact.  Remote memory is intact.  Attention span and concentration are intact.  Expressive speech is intact.  Patient's fund of knowledge is within normal limits for educational level.    Gross Musculoskeletal Assessment Tremor: None Bulk: Normal Tone: Normal  Gait Poor eccentric control of dorsiflexors bilat form heel strike to foot flat; dec single-limb support time, dec arm swing  Posture Forward head, guarded posture   AROM AROM (Normal range in degrees) AROM 10/31/23  Cervical  Flexion (50) 32  Extension (80) 28  Right lateral flexion (45) 25  Left lateral flexion (45) 20  Right rotation (85) 47  Left rotation (85) 42  (* = pain; Blank rows = not tested)  Shoulder forward elevation AROM WNL Functional ER: R significant motion loss, L WFL  MMT/upper limb quick motor screen MMT (out of 5) Right 10/31/23 Left 10/31/23      Shoulder   Flexion 4- 4-  Abduction 4- 4-  Internal rotation    External rotation  3+ 4  Horizontal abduction    Horizontal adduction    Lower Trapezius    Rhomboids  Elbow  Flexion 4- 4-  Extension 4- 4-  Pronation    Supination        Wrist  Flexion    Extension 4 4  Radial deviation    Ulnar deviation    (* = pain; Blank rows = not tested)  R/L 4/4 Hip flexion 5/5 Hip abduction (seated) 5/5 Hip adduction (seated) 5/5 Knee extension 5/5 Knee flexion 4-/4- Ankle Dorsiflexion 5/5 Ankle Plantarflexion *indicates pain  Sensation Grossly intact to light touch bilateral UE/LE as determined by testing dermatomes C2-L5. Proprioception and hot/cold  testing deferred on this date. Subjective numbness in toes; light touch remains intact.   Reflexes Deferred  Palpation Deferred   SPECIAL TESTS Deferred   FUNCTIONAL TESTING TUG: 12.2 sec 5TSTS: 10 sec  BERG: 36/56 (see flowsheet)   NDI: 28% DGI: 19/24 mCTSIB: conditions 1-2: 30s, condition 3: 5s; condition 4: 3s;    TODAY'S TREATMENT: 11/17/2023   SUBJECTIVE: Pt reports no major changes at arrival. He reports pain largely along R paracervical region without notable referral to arm/periscapular region. Patient reports doing well with additional exercises reviewed last visit. Upon further questioning, he states he has not been completing cervical spine ROM/mobility drills given from MD.    PAIN: 5/10 neck pain   AROM Cervical flexion:  50% Cervical extension: 25% (easier) Lateral flexion: R severe motion loss* , Left severe motion loss* Cervical rotation: R 50%, L 50% *Indicates pain   Therapeutic Exercise - improved strength as needed to improve performance of CKC activities/functional movements and as needed for power production to prevent fall during episode of large postural perturbation   NuStep (seat at 11, arms at 8) L5 x 7 minutes for BLE strengthening and cardiovascular challenge during interval history;   Seated resisted plantarflexion with Tband, Black Tband; 3 x 12 each side  PATIENT EDUCATION: Discussed with pt expected timeline for recovery of strength/motor activation in setting of neuromotor weakness affecting plantarflexors/dorsiflexors and toe extensors.    *not today* Total Gym Heel raise; attempted at Level 14 and Level 10; unable to achieve heel raise from platform at lower level   Therapeutic Activity  *next visit* Standing SLS in // bars 3x bilaterally to failure  Forward and lateral step ups, 2x12 each    *not today* Lateral walking over 6-inch hurdles in // bars, 3x down and back  Lateral walking on foam pad in // bars, 4x down  and back Standing hip strengthening with 5# ankle weights:  Hip abduction 2 x 12 BLE;  Hip extension 2 x 12 BLE; Seated LAQ with 5# ankle weights x 10 BLE;   Neuromuscular Re-education   Staggered stance weight shifting with heel to toe rocking; x20 with RLE in back and LLE in back   Functional reaching to targets on wall; 6 targets arranged in semicircle, bilateral stance on Airex with single bar in front; 4x D/B semicircle   Semitandem balance alternating forward LE with horizontal and vertical head turns x 30 sec each with RLE/LLE in back  Forward hurdle stepping, (3) 6-inch hurdles; forward step-over, step-to pattern with heel striking; x4 D/B  Forward ambulation with marches, 4x down and back, along blue agility ladder  -PT CGA _____________________________   STM for desensitization/pain modulation along R splenius cervicis/capitis, scalanes, upper trapezius; x 5 minutes   Not Performed : Hip flexion marches x 10 BLE; HS curls x 10 BLE; Tandem gait in // bars (difficult for patient to maintain narrow BOS without use of UEs);  Manual Therapy - for soft tissue mobility, ROM  Passive cervical rotation; x 5 to R and L Passive cervical sidebend stretch; 2 x 30 sec   PATIENT EDUCATION:  Education details: Pt educated throughout session about proper posture and technique with exercises. Improved exercise technique, movement at target joints, use of target muscles after min to mod verbal, visual, tactile cues.  Person educated: Patient Education method: Medical illustrator Education comprehension: verbalized understanding and returned demonstration   HOME EXERCISE PROGRAM:  Access Code: [email or web address] URL: [email or web address] Date: 11/02/2023 Prepared by: Venetia Endo  Exercises - Seated Heel Toe Raises  - 2 x daily - 7 x weekly - 2 sets - 10 reps - Sit to Stand  - 2 x daily - 7 x weekly - 2 sets - 10 reps - Standing Toe Taps  - 2 x daily -  7 x weekly - 2 sets - 10 reps   ASSESSMENT:  CLINICAL IMPRESSION: Pt is able to rotate C-spine to 50 degrees in either direction. At least 45-50 deg of rotation from C1-2 is expected; some degree of motion loss will persist given C3-5 fusion. Pt has ongoing R paracervical pain, and we completed last 10 mins of session focusing on manual techniques for cervical paraspinal muscle desensitization and soft tissue mobility. Pt has ongoing deficits related to cervical myelopathy and associated distal>proximal lower limb weakness and difficulty with activation of dorsiflexors/great toe extensors. We will complete further active dorsiflexor work next visit. Will continue to monitor symptoms and progress as able.    OBJECTIVE IMPAIRMENTS: Abnormal gait, decreased balance, difficulty walking, decreased ROM, decreased strength, hypomobility, impaired flexibility, impaired UE functional use, postural dysfunction, and pain.   ACTIVITY LIMITATIONS: carrying, lifting, standing, stairs, transfers, and locomotion level  PARTICIPATION LIMITATIONS: driving, shopping, and community activity  PERSONAL FACTORS: Age, Past/current experiences, and 3+ comorbidities: (Hx cervical myelopathy, CAD, HLD, HTN, OA) are also affecting patient's functional outcome.   REHAB POTENTIAL: Good  CLINICAL DECISION MAKING: Unstable/unpredictable  EVALUATION COMPLEXITY: High   GOALS: Goals reviewed with patient? Yes  SHORT TERM GOALS: Target date: 11/23/2023  Pt will be independent with HEP to improve strength and movement control to improve balance and function at home and in community. Baseline: 11/02/23: Baseline HEP initiated.  Goal status: INITIAL   LONG TERM GOALS: Target date: 12/29/2023  Pt will improve BERG to 45/56 or greater indicative of decreased fall risk and improved balance Baseline: 11/02/23: 36/56 Goal status: INITIAL  2.  Pt will decrease worst neck pain by at least 2 points on the NPRS in order to  demonstrate clinically significant reduction in neck pain. Baseline: 11/02/23: 5/10 pain at worst.  Goal status: INITIAL  3.  Pt will improve ABC Scale by at least 13% or greater indicative of clinically meaningful change in pt's balance confidence and function.      Baseline: 11/02/23: 47.5% Goal status: INITIAL  4.  Pt will improve DGI by at least 3 points in order to demonstrate clinically significant improvement in balance and decreased risk for falls.    Baseline: 11/02/23: DGI to be completed on visit #2.  Goal status: INITIAL  5.  Pt will have MMT 4+/5 or greater for all tested UE musculature (pending clearance from physician for activity escalation/lifting of 10-lb restriction) indicative of improved capacity for lifting and carrying  Baseline: 11/02/23: Current lifting restriction, MMTs 3+ to 4/5. Goal status: INITIAL  6.  Pt will decrease NDI score by at least 19% in order demonstrate clinically  significant reduction in neck pain/disability. Baseline: 11/03/23: 28% Goal status: INITIAL  PLAN: PT FREQUENCY: 1-2x/week  PT DURATION: 8 weeks  PLANNED INTERVENTIONS: Therapeutic exercises, Therapeutic activity, Neuromuscular re-education, Balance training, Gait training, Patient/Family education, Self Care, Joint mobilization, Joint manipulation, Vestibular training, Canalith repositioning, Orthotic/Fit training, DME instructions, Dry Needling, Electrical stimulation, Spinal manipulation, Spinal mobilization, Cryotherapy, Moist heat, Taping, Traction, Ultrasound, Ionotophoresis 4mg /ml Dexamethasone , Manual therapy, and Re-evaluation.  PLAN FOR NEXT SESSION: Continue with dynamic balance activities (including activities c reaching outside of BOS) and general strengthening exercises to improve endurance. Continue with plantarflexor/dorsiflexor activation and motor control re-training for stepping pattern and obstacle clearance.    Venetia Endo, PT, DPT #[id number]  Venetia ONEIDA Endo,  PT 11/17/2023, 9:46 AM

## 2023-11-22 ENCOUNTER — Ambulatory Visit: Admitting: Physical Therapy

## 2023-11-22 DIAGNOSIS — M542 Cervicalgia: Secondary | ICD-10-CM

## 2023-11-22 DIAGNOSIS — M6281 Muscle weakness (generalized): Secondary | ICD-10-CM | POA: Diagnosis not present

## 2023-11-22 DIAGNOSIS — R262 Difficulty in walking, not elsewhere classified: Secondary | ICD-10-CM

## 2023-11-22 DIAGNOSIS — Z981 Arthrodesis status: Secondary | ICD-10-CM

## 2023-11-22 NOTE — Therapy (Unsigned)
 OUTPATIENT PHYSICAL THERAPY POST-OP TREATMENT (CERVICAL FUSION)   Patient Name: Mario Wilson MRN: [id number] DOB:Jan 15, 1951, 73 y.o., male Today's Date: 11/22/2023  END OF SESSION:  PT End of Session - 11/22/23 1302     Visit Number 6    Number of Visits 21    Date for PT Re-Evaluation 01/10/24    Authorization Type eval: 11/01/23    PT Start Time 1300    PT Stop Time 1343    PT Time Calculation (min) 43 min    Equipment Utilized During Treatment Gait belt    Activity Tolerance Patient tolerated treatment well;Patient limited by fatigue    Behavior During Therapy WFL for tasks assessed/performed             Past Medical History:  Diagnosis Date   Bifascicular block (RBBB + LAFB)    Cervical myelopathy (HCC)    Cervical radiculopathy    Cervical stenosis (uterine cervix)    Coronary artery disease    stents x 2 placed in [place]  in approximately 2014-2015   ED (erectile dysfunction)    Hyperlipidemia    Hypertension    Hypogonadism in male    Osteoarthritis    Past Surgical History:  Procedure Laterality Date   ANTERIOR CERVICAL DECOMP/DISCECTOMY FUSION N/A 09/16/2023   Procedure: ANTERIOR CERVICAL DECOMPRESSION/DISCECTOMY FUSION 2 LEVELS;  Surgeon: Clois Fret, MD;  Location: [facility];  Service: Neurosurgery;  Laterality: N/A;  C3-5 ANTERIOR CERVICAL DISCECTOMY AND FUSION   CARDIAC CATHETERIZATION  2015   stent   COLONOSCOPY     CORONARY ANGIOPLASTY     EXCISION MORTON'S NEUROMA Left 07/04/2019   Procedure: EXCISION MORTON'S NEUROMA LEFT TOE;  Surgeon: Ashley Soulier, DPM;  Location: [facility];  Service: Podiatry;  Laterality: Left;  LOCAL WITH IV   EXCISION MORTON'S NEUROMA Left 08/06/2020   Procedure: EXCISION MORTON'S NEUROMA- LEFT FT X2;  Surgeon: Ashley Soulier, DPM;  Location: [facility];  Service: Podiatry;  Laterality: Left;  GENERAL WITH LOCAL   KNEE ARTHROPLASTY Left 08/20/2019   Procedure: COMPUTER ASSISTED TOTAL KNEE  ARTHROPLASTY;  Surgeon: Mardee Lynwood SQUIBB, MD;  Location: [facility];  Service: Orthopedics;  Laterality: Left;   KNEE ARTHROPLASTY Right 11/12/2020   Procedure: COMPUTER ASSISTED TOTAL KNEE ARTHROPLASTY;  Surgeon: Mardee Lynwood SQUIBB, MD;  Location: [facility];  Service: Orthopedics;  Laterality: Right;   TOTAL HIP ARTHROPLASTY Left 02/2014   Patient Active Problem List   Diagnosis Date Noted   Cervical myelopathy (HCC) 09/16/2023   Cervical radiculopathy 09/16/2023   Spinal stenosis in cervical region 09/16/2023   Total knee replacement status 11/12/2020   Status post total left knee replacement 08/20/2019   History of coronary artery stent placement 08/16/2019   Severe obesity (BMI 35.0-39.9) with comorbidity (HCC) 08/16/2019   Primary osteoarthritis of right knee 09/03/2018   2-vessel coronary artery disease 02/05/2015   Erectile dysfunction 02/05/2015   Essential hypertension 02/05/2015   Hx of hypogonadism 02/05/2015   Mixed hyperlipidemia 02/05/2015    PCP: Trinidad Cassondra BROCKS, MD  REFERRING PROVIDER: Clois Fret, MD  REFERRING DIAG:  G95.9 (ICD-10-CM) - Cervical myelopathy (HCC)  Z98.1 (ICD-10-CM) - S/P cervical spinal fusion   RATIONALE FOR EVALUATION AND TREATMENT: Rehabilitation  THERAPY DIAG: Muscle weakness (generalized)  Difficulty in walking, not elsewhere classified  Cervicalgia  S/P cervical spinal fusion  ONSET DATE: S/p ACDF C3-5 for myelopathy (DOS: 09/16/23)  FOLLOW-UP APPT SCHEDULED WITH REFERRING PROVIDER: Yes ; F/u late next month   FROM INITIAL EVALUATION SUBJECTIVE:  Chief Complaint: Pt is a 74 year old male s/p ACDF C3-5 09/16/23 for cervical myelopathy; pt currently referred to PT for neck, R shoulder, imbalance.   Pertinent History Pt is a 73 year old male s/p ACDF  C3-5 09/16/23 for cervical myelopathy; pt currently referred to PT for neck, R shoulder, imbalance.   Pt had notable difficulty with pain control early post-operatively. He feels that his current pain is manageable - largely localized to R paracervical region/medial R upper trapezius. He states he has exercises from Dr. Clois given for neck - no specific exercises named. Patient is more concerned with gait/balance and ability to get around and has not been given specific exercises for this. He reports challenge with functional ER with R upper limb today; he has functional forward elevation of bilat shoulders. Pt still under 10-lb lifting restriction to his knowledge.   Pt reports primary concern of unsteadiness. No recent falls. He reports numbness in toes of either foot.   Pain:  Pain Intensity: Present: 4-5/10, Best: 0/10, Worst: 5/10 Pain location: R paracervical/medial R upper trap  Pain Quality: steady ache Radiating: No  Numbness/Tingling: Yes; numbness in toes, none in upper limbs per pt Focal Weakness: Yes; lifting toes Aggravating factors: activity throuhgout day  Relieving factors: lying down  24-hour pain behavior: worse as day goes on  History of prior neck injury, pain, surgery, or therapy: Yes; Hx of bilat TKA, cervical fusion C3-5 09/16/23, L THA Dominant hand: right Imaging: Yes; post-op follow-up imaging, no comment from MD per pt on any hardware concerns   Red flags (personal history of cancer, h/o spinal tumors, history of compression fracture, chills/fever, night sweats, nausea, vomiting, unrelenting pain): Negative  PRECAUTIONS: Other: Lifting precaution - up to 10 lbs     WEIGHT BEARING RESTRICTIONS: No  FALLS: Has patient fallen in last 6 months? 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  Recent memory is intact.  Remote memory is intact.  Attention span and concentration are intact.  Expressive speech is intact.  Patient's fund of knowledge is within normal limits for educational level.    Gross Musculoskeletal Assessment Tremor: None Bulk: Normal Tone: Normal  Gait Poor eccentric control of dorsiflexors bilat form heel strike to foot flat; dec single-limb support time, dec arm swing  Posture Forward head, guarded posture   AROM AROM (Normal range in degrees) AROM 10/31/23  Cervical  Flexion (50) 32  Extension (80) 28  Right lateral flexion (45) 25  Left lateral flexion (45) 20  Right rotation (85) 47  Left rotation (85) 42  (* = pain; Blank rows = not tested)  Shoulder forward elevation AROM WNL Functional ER: R significant motion loss, L WFL  MMT/upper limb quick motor screen MMT (out of 5) Right 10/31/23 Left 10/31/23      Shoulder   Flexion 4- 4-  Abduction 4- 4-  Internal rotation    External rotation  3+ 4  Horizontal abduction    Horizontal adduction    Lower Trapezius    Rhomboids  Elbow  Flexion 4- 4-  Extension 4- 4-  Pronation    Supination        Wrist  Flexion    Extension 4 4  Radial deviation    Ulnar deviation    (* = pain; Blank rows = not tested)  R/L 4/4 Hip flexion 5/5 Hip abduction (seated) 5/5 Hip adduction (seated) 5/5 Knee extension 5/5 Knee flexion 4-/4- Ankle Dorsiflexion 5/5 Ankle Plantarflexion *indicates pain  Sensation Grossly intact to light touch bilateral UE/LE as determined by testing dermatomes C2-L5. Proprioception and hot/cold  testing deferred on this date. Subjective numbness in toes; light touch remains intact.   Reflexes Deferred  Palpation Deferred   SPECIAL TESTS Deferred   FUNCTIONAL TESTING TUG: 12.2 sec 5TSTS: 10 sec  BERG: 36/56 (see flowsheet)   NDI: 28% DGI: 19/24 mCTSIB: conditions 1-2: 30s, condition 3: 5s; condition 4: 3s;    TODAY'S TREATMENT: 11/22/2023   SUBJECTIVE: Pt reports no major changes at arrival. He reports 4-5/10 pain affecting R paracervical region last visit. Patient reports ongoing continuous discomfort in R cervical paraspinal region that is largely unchanged.    PAIN: 5/10 neck pain   AROM Cervical flexion:  50% Cervical extension: 25% (easier) Lateral flexion: R severe motion loss* , Left severe motion loss* Cervical rotation: R 50%, L 50% *Indicates pain   Therapeutic Exercise - improved strength as needed to improve performance of CKC activities/functional movements and as needed for power production to prevent fall during episode of large postural perturbation   NuStep (seat at 11, arms at 8) L5 x 7 minutes for BLE strengthening and cardiovascular challenge during interval history;    PATIENT EDUCATION: Discussed with pt expected timeline for recovery of strength/motor activation in setting of neuromotor weakness affecting plantarflexors/dorsiflexors and toe extensors.    *not today* Seated resisted plantarflexion with Tband, Black Tband; 3 x 12 each side [employer] Gym Heel raise; attempted at Level 14 and Level 10; unable to achieve heel raise from platform at lower level   Therapeutic Activity  Forward and lateral step ups, 2x12 each    *not today* Standing SLS in // bars 3x bilaterally to failure  Lateral walking over 6-inch hurdles in // bars, 3x down and back  Lateral walking on foam pad in // bars, 4x down and back Standing hip strengthening with 5# ankle weights:  Hip abduction 2 x 12 BLE;  Hip extension 2 x 12 BLE; Seated LAQ with 5#  ankle weights x 10 BLE;   Neuromuscular Re-education   Staggered stance weight shifting with heel to toe rocking; x20 with RLE in back and LLE in back   Functional reaching to targets on wall; 6 targets arranged in semicircle, bilateral stance on Airex with single bar in front; 4x D/B semicircle   Semitandem balance alternating forward LE with horizontal and vertical head turns x 30 sec each with RLE/LLE in back  Forward hurdle stepping, (3) 6-inch hurdles; forward step-over, step-to pattern with heel striking; x4 D/B  Forward ambulation with marches, 4x down and back, along blue agility ladder  -PT CGA _____________________________   STM for desensitization/pain modulation along R splenius cervicis/capitis, scalanes, upper trapezius; x 5 minutes   Not Performed : Hip flexion marches x 10 BLE; HS curls x 10 BLE; Tandem gait in // bars (difficult for patient to maintain narrow BOS without use of UEs);    Manual Therapy - for soft tissue mobility, ROM  Passive cervical rotation; x 5 to R and L  Passive cervical sidebend stretch; 2 x 30 sec   PATIENT EDUCATION:  Education details: Pt educated throughout session about proper posture and technique with exercises. Improved exercise technique, movement at target joints, use of target muscles after min to mod verbal, visual, tactile cues.  Person educated: Patient Education method: Medical illustrator Education comprehension: verbalized understanding and returned demonstration   HOME EXERCISE PROGRAM:  Access Code: [email or web address] URL: [email or web address] Date: 11/02/2023 Prepared by: Venetia Endo  Exercises - Seated Heel Toe Raises  - 2 x daily - 7 x weekly - 2 sets - 10 reps - Sit to Stand  - 2 x daily - 7 x weekly - 2 sets - 10 reps - Standing Toe Taps  - 2 x daily - 7 x weekly - 2 sets - 10 reps   ASSESSMENT:  CLINICAL IMPRESSION: Pt is able to rotate C-spine to 50 degrees in either direction.  At least 45-50 deg of rotation from C1-2 is expected; some degree of motion loss will persist given C3-5 fusion. Pt has ongoing R paracervical pain, and we completed last 10 mins of session focusing on manual techniques for cervical paraspinal muscle desensitization and soft tissue mobility. Pt has ongoing deficits related to cervical myelopathy and associated distal>proximal lower limb weakness and difficulty with activation of dorsiflexors/great toe extensors. We will complete further active dorsiflexor work next visit. Will continue to monitor symptoms and progress as able.    OBJECTIVE IMPAIRMENTS: Abnormal gait, decreased balance, difficulty walking, decreased ROM, decreased strength, hypomobility, impaired flexibility, impaired UE functional use, postural dysfunction, and pain.   ACTIVITY LIMITATIONS: carrying, lifting, standing, stairs, transfers, and locomotion level  PARTICIPATION LIMITATIONS: driving, shopping, and community activity  PERSONAL FACTORS: Age, Past/current experiences, and 3+ comorbidities: (Hx cervical myelopathy, CAD, HLD, HTN, OA) are also affecting patient's functional outcome.   REHAB POTENTIAL: Good  CLINICAL DECISION MAKING: Unstable/unpredictable  EVALUATION COMPLEXITY: High   GOALS: Goals reviewed with patient? Yes  SHORT TERM GOALS: Target date: 11/23/2023  Pt will be independent with HEP to improve strength and movement control to improve balance and function at home and in community. Baseline: 11/02/23: Baseline HEP initiated.  Goal status: INITIAL   LONG TERM GOALS: Target date: 12/29/2023  Pt will improve BERG to 45/56 or greater indicative of decreased fall risk and improved balance Baseline: 11/02/23: 36/56 Goal status: INITIAL  2.  Pt will decrease worst neck pain by at least 2 points on the NPRS in order to demonstrate clinically significant reduction in neck pain. Baseline: 11/02/23: 5/10 pain at worst.  Goal status: INITIAL  3.  Pt will  improve ABC Scale by at least 13% or greater indicative of clinically meaningful change in pt's balance confidence and function.      Baseline: 11/02/23: 47.5% Goal status: INITIAL  4.  Pt will improve DGI by at least 3 points in order to demonstrate clinically significant improvement in balance and decreased risk for falls.    Baseline: 11/02/23: DGI to be completed on visit #2.  Goal status: INITIAL  5.  Pt will have MMT 4+/5 or greater for all tested UE musculature (pending clearance from physician for activity escalation/lifting of 10-lb restriction) indicative of improved capacity for lifting and carrying  Baseline: 11/02/23: Current lifting restriction, MMTs 3+ to 4/5. Goal status: INITIAL  6.  Pt will decrease NDI score by at least 19% in order demonstrate clinically significant reduction in neck pain/disability. Baseline: 11/03/23: 28% Goal status: INITIAL  PLAN: PT FREQUENCY: 1-2x/week  PT  DURATION: 8 weeks  PLANNED INTERVENTIONS: Therapeutic exercises, Therapeutic activity, Neuromuscular re-education, Balance training, Gait training, Patient/Family education, Self Care, Joint mobilization, Joint manipulation, Vestibular training, Canalith repositioning, Orthotic/Fit training, DME instructions, Dry Needling, Electrical stimulation, Spinal manipulation, Spinal mobilization, Cryotherapy, Moist heat, Taping, Traction, Ultrasound, Ionotophoresis 4mg /ml Dexamethasone , Manual therapy, and Re-evaluation.  PLAN FOR NEXT SESSION: Continue with dynamic balance activities (including activities c reaching outside of BOS) and general strengthening exercises to improve endurance. Continue with plantarflexor/dorsiflexor activation and motor control re-training for stepping pattern and obstacle clearance.    Venetia Endo, PT, DPT #[id number]  Venetia ONEIDA Endo, PT 11/22/2023, 1:04 PM

## 2023-11-24 ENCOUNTER — Ambulatory Visit: Admitting: Physical Therapy

## 2023-11-25 ENCOUNTER — Encounter: Payer: Self-pay | Admitting: Physical Therapy

## 2023-11-29 ENCOUNTER — Ambulatory Visit: Admitting: Physical Therapy

## 2023-11-29 DIAGNOSIS — M6281 Muscle weakness (generalized): Secondary | ICD-10-CM

## 2023-11-29 DIAGNOSIS — M542 Cervicalgia: Secondary | ICD-10-CM

## 2023-11-29 DIAGNOSIS — Z981 Arthrodesis status: Secondary | ICD-10-CM

## 2023-11-29 DIAGNOSIS — R262 Difficulty in walking, not elsewhere classified: Secondary | ICD-10-CM

## 2023-11-29 NOTE — Therapy (Unsigned)
 OUTPATIENT PHYSICAL THERAPY POST-OP TREATMENT (CERVICAL FUSION)   Patient Name: Mario Wilson MRN: [id number] DOB:08/29/1950, 73 y.o., male Today's Date: 11/29/2023  END OF SESSION:       Past Medical History:  Diagnosis Date   Bifascicular block (RBBB + LAFB)    Cervical myelopathy (HCC)    Cervical radiculopathy    Cervical stenosis (uterine cervix)    Coronary artery disease    stents x 2 placed in [place]  in approximately 2014-2015   ED (erectile dysfunction)    Hyperlipidemia    Hypertension    Hypogonadism in male    Osteoarthritis    Past Surgical History:  Procedure Laterality Date   ANTERIOR CERVICAL DECOMP/DISCECTOMY FUSION N/A 09/16/2023   Procedure: ANTERIOR CERVICAL DECOMPRESSION/DISCECTOMY FUSION 2 LEVELS;  Surgeon: Clois Fret, MD;  Location: [facility];  Service: Neurosurgery;  Laterality: N/A;  C3-5 ANTERIOR CERVICAL DISCECTOMY AND FUSION   CARDIAC CATHETERIZATION  2015   stent   COLONOSCOPY     CORONARY ANGIOPLASTY     EXCISION MORTON'S NEUROMA Left 07/04/2019   Procedure: EXCISION MORTON'S NEUROMA LEFT TOE;  Surgeon: Ashley Soulier, DPM;  Location: [facility];  Service: Podiatry;  Laterality: Left;  LOCAL WITH IV   EXCISION MORTON'S NEUROMA Left 08/06/2020   Procedure: EXCISION MORTON'S NEUROMA- LEFT FT X2;  Surgeon: Ashley Soulier, DPM;  Location: [facility];  Service: Podiatry;  Laterality: Left;  GENERAL WITH LOCAL   KNEE ARTHROPLASTY Left 08/20/2019   Procedure: COMPUTER ASSISTED TOTAL KNEE ARTHROPLASTY;  Surgeon: Mardee Lynwood SQUIBB, MD;  Location: [facility];  Service: Orthopedics;  Laterality: Left;   KNEE ARTHROPLASTY Right 11/12/2020   Procedure: COMPUTER ASSISTED TOTAL KNEE ARTHROPLASTY;  Surgeon: Mardee Lynwood SQUIBB, MD;  Location: [facility];  Service: Orthopedics;  Laterality: Right;   TOTAL HIP ARTHROPLASTY Left 02/2014   Patient Active Problem List   Diagnosis Date Noted   Cervical myelopathy (HCC) 09/16/2023   Cervical  radiculopathy 09/16/2023   Spinal stenosis in cervical region 09/16/2023   Total knee replacement status 11/12/2020   Status post total left knee replacement 08/20/2019   History of coronary artery stent placement 08/16/2019   Severe obesity (BMI 35.0-39.9) with comorbidity (HCC) 08/16/2019   Primary osteoarthritis of right knee 09/03/2018   2-vessel coronary artery disease 02/05/2015   Erectile dysfunction 02/05/2015   Essential hypertension 02/05/2015   Hx of hypogonadism 02/05/2015   Mixed hyperlipidemia 02/05/2015    PCP: Trinidad Cassondra BROCKS, MD  REFERRING PROVIDER: Clois Fret, MD  REFERRING DIAG:  G95.9 (ICD-10-CM) - Cervical myelopathy (HCC)  Z98.1 (ICD-10-CM) - S/P cervical spinal fusion   RATIONALE FOR EVALUATION AND TREATMENT: Rehabilitation  THERAPY DIAG: Muscle weakness (generalized)  Difficulty in walking, not elsewhere classified  Cervicalgia  S/P cervical spinal fusion  ONSET DATE: S/p ACDF C3-5 for myelopathy (DOS: 09/16/23)  FOLLOW-UP APPT SCHEDULED WITH REFERRING PROVIDER: Yes ; F/u late next month   FROM INITIAL EVALUATION SUBJECTIVE:  Chief Complaint: Pt is a 73 year old male s/p ACDF C3-5 09/16/23 for cervical myelopathy; pt currently referred to PT for neck, R shoulder, imbalance.   Pertinent History Pt is a 73 year old male s/p ACDF C3-5 09/16/23 for cervical myelopathy; pt currently referred to PT for neck, R shoulder, imbalance.   Pt had notable difficulty with pain control early post-operatively. He feels that his current pain is manageable - largely localized to R paracervical region/medial R upper trapezius. He states he has exercises from Dr. Clois given for neck - no specific exercises named. Patient is more concerned with gait/balance and ability to get  around and has not been given specific exercises for this. He reports challenge with functional ER with R upper limb today; he has functional forward elevation of bilat shoulders. Pt still under 10-lb lifting restriction to his knowledge.   Pt reports primary concern of unsteadiness. No recent falls. He reports numbness in toes of either foot.   Pain:  Pain Intensity: Present: 4-5/10, Best: 0/10, Worst: 5/10 Pain location: R paracervical/medial R upper trap  Pain Quality: steady ache Radiating: No  Numbness/Tingling: Yes; numbness in toes, none in upper limbs per pt Focal Weakness: Yes; lifting toes Aggravating factors: activity throuhgout day  Relieving factors: lying down  24-hour pain behavior: worse as day goes on  History of prior neck injury, pain, surgery, or therapy: Yes; Hx of bilat TKA, cervical fusion C3-5 09/16/23, L THA Dominant hand: right Imaging: Yes; post-op follow-up imaging, no comment from MD per pt on any hardware concerns   Red flags (personal history of cancer, h/o spinal tumors, history of compression fracture, chills/fever, night sweats, nausea, vomiting, unrelenting pain): Negative  PRECAUTIONS: Other: Lifting precaution - up to 10 lbs     WEIGHT BEARING RESTRICTIONS: No  FALLS: Has patient fallen in last 6 months? 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  Recent memory is intact.  Remote memory is intact.  Attention span and  concentration are intact.  Expressive speech is intact.  Patient's fund of knowledge is within normal limits for educational level.    Gross Musculoskeletal Assessment Tremor: None Bulk: Normal Tone: Normal  Gait Poor eccentric control of dorsiflexors bilat form heel strike to foot flat; dec single-limb support time, dec arm swing  Posture Forward head, guarded posture   AROM AROM (Normal range in degrees) AROM 10/31/23  Cervical  Flexion (50) 32  Extension (80) 28  Right lateral flexion (45) 25  Left lateral flexion (45) 20  Right rotation (85) 47  Left rotation (85) 42  (* = pain; Blank rows = not tested)  Shoulder forward elevation AROM WNL Functional ER: R significant motion loss, L WFL  MMT/upper limb quick motor screen MMT (out of 5) Right 10/31/23 Left 10/31/23      Shoulder   Flexion 4- 4-  Abduction 4- 4-  Internal rotation    External rotation  3+ 4  Horizontal abduction    Horizontal adduction    Lower Trapezius    Rhomboids  Elbow  Flexion 4- 4-  Extension 4- 4-  Pronation    Supination        Wrist  Flexion    Extension 4 4  Radial deviation    Ulnar deviation    (* = pain; Blank rows = not tested)  R/L 4/4 Hip flexion 5/5 Hip abduction (seated) 5/5 Hip adduction (seated) 5/5 Knee extension 5/5 Knee flexion 4-/4- Ankle Dorsiflexion 5/5 Ankle Plantarflexion *indicates pain  Sensation Grossly intact to light touch bilateral UE/LE as determined by testing dermatomes C2-L5. Proprioception and hot/cold testing deferred on this date. Subjective numbness in toes; light touch remains intact.   Reflexes Deferred  Palpation Deferred   SPECIAL TESTS Deferred   FUNCTIONAL TESTING TUG: 12.2 sec 5TSTS: 10 sec  BERG: 36/56 (see flowsheet)   NDI: 28% DGI: 19/24 mCTSIB: conditions 1-2: 30s, condition 3: 5s; condition 4: 3s;    TODAY'S TREATMENT: 11/29/2023   SUBJECTIVE: Pt reports no major changes at arrival. He reports  4-5/10 pain affecting R paracervical region last visit. Patient reports ongoing continuous discomfort in R cervical paraspinal region that is largely unchanged.    PAIN: 5/10 neck pain    Therapeutic Exercise - improved strength as needed to improve performance of CKC activities/functional movements and as needed for power production to prevent fall during episode of large postural perturbation   NuStep (seat at 11, arms at 8) L5 x 7 minutes for BLE strengthening and cardiovascular challenge during interval history;   PATIENT EDUCATION: Discussed with pt expected timeline for recovery of strength/motor activation in setting of neuromotor weakness affecting plantarflexors/dorsiflexors and toe extensors.    *not today* Seated resisted plantarflexion with Tband, Black Tband; 3 x 12 each side Total Gym Heel raise; attempted at Level 14 and Level 10; unable to achieve heel raise from platform at lower level   Therapeutic Activity - activities to improve capacity for gait and stair negotiation  Forward and lateral step ups, 2x12 each  Forward march with opposite knee touch  Standing march with Tband around metatarsal heads (closed loop around forefeet) for resisted with march; 1x10 Yellow Tband, 1x10 Red Tband  *not today* Standing SLS in // bars 3x bilaterally to failure  Lateral walking over 6-inch hurdles in // bars, 3x down and back  Lateral walking on foam pad in // bars, 4x down and back Standing hip strengthening with 5# ankle weights:  Hip abduction 2 x 12 BLE;  Hip extension 2 x 12 BLE; Seated LAQ with 5# ankle weights x 10 BLE;   Neuromuscular Re-education   Staggered stance weight shifting with heel to toe rocking; x20 with RLE in back and LLE in back   Functional reaching to targets on wall; 6 targets arranged in semicircle, bilateral stance on Airex with single bar in front; 4x D/B semicircle   Semitandem balance alternating forward LE with horizontal and vertical head  turns x 30 sec each with RLE/LLE in back  Forward hurdle stepping, (3) 6-inch hurdles; forward step-over, step-to pattern with heel striking; x4 D/B    Not Performed : STM for desensitization/pain modulation along R splenius cervicis/capitis, scalanes, upper trapezius; x 5 minutes Forward ambulation with marches, 4x down and back, along blue agility ladder  -PT CGA Hip flexion marches x 10 BLE; HS curls x 10 BLE; Tandem gait in // bars (difficult for patient to maintain narrow BOS without use of UEs);    PATIENT EDUCATION:  Education details: Pt educated throughout session about proper posture and technique  with exercises. Improved exercise technique, movement at target joints, use of target muscles after min to mod verbal, visual, tactile cues.  Person educated: Patient Education method: Medical illustrator Education comprehension: verbalized understanding and returned demonstration   HOME EXERCISE PROGRAM:  Access Code: [email or web address] URL: [email or web address] Date: 11/02/2023 Prepared by: Venetia Endo  Exercises - Seated Heel Toe Raises  - 2 x daily - 7 x weekly - 2 sets - 10 reps - Sit to Stand  - 2 x daily - 7 x weekly - 2 sets - 10 reps - Standing Toe Taps  - 2 x daily - 7 x weekly - 2 sets - 10 reps   ASSESSMENT:  CLINICAL IMPRESSION: Pt reports no notable effect from trial of manual therapy and gentle stretching for paracervical musculature last visit. He has ongoing primary concern of LE weakness, instability, and gait difficulty. We focused again on primary concerns today for LE strength, balance, and gait stability. Pt is continuing with gentle C-spine ROM drills given from surgeon; we also reviewed simple supine cervical sidebend stretching to use for home at previous visit. Pt has ongoing deficits related to cervical myelopathy and associated distal>proximal lower limb weakness and difficulty with activation of dorsiflexors/great toe extensors;  pt has ongoing constant 5/10 pain in R paracervical region that is largely unchanged throughout post-operative time period and regardless of treatment. We will complete further active dorsiflexor work next visit. Will continue to monitor symptoms and progress as able.    OBJECTIVE IMPAIRMENTS: Abnormal gait, decreased balance, difficulty walking, decreased ROM, decreased strength, hypomobility, impaired flexibility, impaired UE functional use, postural dysfunction, and pain.   ACTIVITY LIMITATIONS: carrying, lifting, standing, stairs, transfers, and locomotion level  PARTICIPATION LIMITATIONS: driving, shopping, and community activity  PERSONAL FACTORS: Age, Past/current experiences, and 3+ comorbidities: (Hx cervical myelopathy, CAD, HLD, HTN, OA) are also affecting patient's functional outcome.   REHAB POTENTIAL: Good  CLINICAL DECISION MAKING: Unstable/unpredictable  EVALUATION COMPLEXITY: High   GOALS: Goals reviewed with patient? Yes  SHORT TERM GOALS: Target date: 11/23/2023  Pt will be independent with HEP to improve strength and movement control to improve balance and function at home and in community. Baseline: 11/02/23: Baseline HEP initiated.  Goal status: INITIAL   LONG TERM GOALS: Target date: 12/29/2023  Pt will improve BERG to 45/56 or greater indicative of decreased fall risk and improved balance Baseline: 11/02/23: 36/56 Goal status: INITIAL  2.  Pt will decrease worst neck pain by at least 2 points on the NPRS in order to demonstrate clinically significant reduction in neck pain. Baseline: 11/02/23: 5/10 pain at worst.  Goal status: INITIAL  3.  Pt will improve ABC Scale by at least 13% or greater indicative of clinically meaningful change in pt's balance confidence and function.      Baseline: 11/02/23: 47.5% Goal status: INITIAL  4.  Pt will improve DGI by at least 3 points in order to demonstrate clinically significant improvement in balance and decreased  risk for falls.    Baseline: 11/02/23: DGI to be completed on visit #2.  Goal status: INITIAL  5.  Pt will have MMT 4+/5 or greater for all tested UE musculature (pending clearance from physician for activity escalation/lifting of 10-lb restriction) indicative of improved capacity for lifting and carrying  Baseline: 11/02/23: Current lifting restriction, MMTs 3+ to 4/5. Goal status: INITIAL  6.  Pt will decrease NDI score by at least 19% in order demonstrate clinically significant reduction in neck pain/disability. Baseline: 11/03/23: 28% Goal  status: INITIAL  PLAN: PT FREQUENCY: 1-2x/week  PT DURATION: 8 weeks  PLANNED INTERVENTIONS: Therapeutic exercises, Therapeutic activity, Neuromuscular re-education, Balance training, Gait training, Patient/Family education, Self Care, Joint mobilization, Joint manipulation, Vestibular training, Canalith repositioning, Orthotic/Fit training, DME instructions, Dry Needling, Electrical stimulation, Spinal manipulation, Spinal mobilization, Cryotherapy, Moist heat, Taping, Traction, Ultrasound, Ionotophoresis 4mg /ml Dexamethasone , Manual therapy, and Re-evaluation.  PLAN FOR NEXT SESSION: Continue with dynamic balance activities (including activities c reaching outside of BOS) and general strengthening exercises to improve endurance. Continue with plantarflexor/dorsiflexor activation and motor control re-training for stepping pattern and obstacle clearance.    Venetia Endo, PT, DPT #[id number]  Venetia ONEIDA Endo, PT 11/29/2023, 8:10 AM

## 2023-12-01 ENCOUNTER — Ambulatory Visit: Admitting: Physical Therapy

## 2023-12-01 ENCOUNTER — Other Ambulatory Visit: Payer: Self-pay

## 2023-12-01 ENCOUNTER — Encounter: Payer: Self-pay | Admitting: Physical Therapy

## 2023-12-01 DIAGNOSIS — G959 Disease of spinal cord, unspecified: Secondary | ICD-10-CM

## 2023-12-01 DIAGNOSIS — M5412 Radiculopathy, cervical region: Secondary | ICD-10-CM

## 2023-12-05 NOTE — Progress Notes (Unsigned)
   REFERRING PHYSICIAN:  [facility] [address] [place],  [place] [address]  DOS: 09/16/23  ACDF C3-C5 for cervical myelopathy  HISTORY OF PRESENT ILLNESS:  He was still having some neck and right arm discomfort at his last visit.   He continues with more constant right sided/posterior neck pain. No pain with sleeping. He has intermittent right arm pain to his elbow. No numbness, tingling, or weakness in his arm. He thinks balance is the same as it was preop. He still has numbness/tingling in his toes.   He is still in PT and doesn't feel like it helped.    PHYSICAL EXAMINATION:  NEUROLOGICAL:  General: In no acute distress.   Awake, alert, oriented to person, place, and time.  Pupils equal round and reactive to light.  Facial tone is symmetric.    Strength: Side Biceps Triceps Deltoid Interossei Grip Wrist Ext. Wrist Flex.  R 5 5 5 5 5 5 5   L 5 5 5 5 5 5 5    Incision well healed  Imaging:  none  Assessment / Plan: Mario Wilson is doing fair s/p above surgery.  He is frustrated that he has not seen improvement in his balance or pain.   Treatment options reviewed with patient and following plan made:  - Discussed that surgery was done to prevent his symptoms from getting worse, but patients typically see improvement. This can take up to a year postop.  - Will get cervical xrays today and I will review with Dr. Clois.  - He should continue with PT for now.  - Will message him with further plan/follow up once we review his xrays.    Advised to contact the office if any questions or concerns arise.   BP was slightly elevated. No symptoms of chest pain, shortness of breath, blurry vision, or headaches. If he develops CP, SOB, blurry vision, or headaches, then he will go to ED.     Mario Boys PA-C Dept of Neurosurgery

## 2023-12-06 ENCOUNTER — Ambulatory Visit (INDEPENDENT_AMBULATORY_CARE_PROVIDER_SITE_OTHER): Admitting: Orthopedic Surgery

## 2023-12-06 ENCOUNTER — Ambulatory Visit
Admission: RE | Admit: 2023-12-06 | Discharge: 2023-12-06 | Disposition: A | Discharge status: Home / Self Care | Source: Ambulatory Visit | Attending: Orthopedic Surgery | Admitting: Orthopedic Surgery

## 2023-12-06 ENCOUNTER — Encounter: Payer: Self-pay | Admitting: Orthopedic Surgery

## 2023-12-06 ENCOUNTER — Ambulatory Visit
Admission: RE | Admit: 2023-12-06 | Discharge: 2023-12-06 | Disposition: A | Discharge status: Home / Self Care | Attending: Orthopedic Surgery | Admitting: Orthopedic Surgery

## 2023-12-06 ENCOUNTER — Ambulatory Visit: Admitting: Physical Therapy

## 2023-12-06 VITALS — BP 142/92 | Ht 72.0 in | Wt 240.0 lb

## 2023-12-06 DIAGNOSIS — Z981 Arthrodesis status: Secondary | ICD-10-CM

## 2023-12-06 DIAGNOSIS — M5412 Radiculopathy, cervical region: Secondary | ICD-10-CM | POA: Insufficient documentation

## 2023-12-06 DIAGNOSIS — G959 Disease of spinal cord, unspecified: Secondary | ICD-10-CM | POA: Diagnosis present

## 2023-12-08 ENCOUNTER — Ambulatory Visit: Admitting: Physical Therapy

## 2023-12-13 ENCOUNTER — Ambulatory Visit: Admitting: Physical Therapy

## 2023-12-15 ENCOUNTER — Encounter: Admitting: Physical Therapy

## 2023-12-20 ENCOUNTER — Encounter: Admitting: Physical Therapy

## 2023-12-22 ENCOUNTER — Encounter: Admitting: Physical Therapy

## 2023-12-27 ENCOUNTER — Encounter: Admitting: Physical Therapy

## 2023-12-29 ENCOUNTER — Encounter: Admitting: Physical Therapy

## 2024-01-03 ENCOUNTER — Encounter: Admitting: Physical Therapy

## 2024-01-05 ENCOUNTER — Encounter: Admitting: Physical Therapy

## 2024-01-10 ENCOUNTER — Encounter: Admitting: Physical Therapy

## 2024-01-12 ENCOUNTER — Encounter: Admitting: Physical Therapy

## 2024-02-28 ENCOUNTER — Other Ambulatory Visit: Payer: Self-pay | Admitting: Neurosurgery

## 2024-02-28 DIAGNOSIS — G959 Disease of spinal cord, unspecified: Secondary | ICD-10-CM

## 2024-03-13 ENCOUNTER — Ambulatory Visit

## 2024-03-13 ENCOUNTER — Encounter: Payer: Self-pay | Admitting: Neurosurgery

## 2024-03-13 ENCOUNTER — Ambulatory Visit: Admitting: Neurosurgery

## 2024-03-13 VITALS — BP 130/82 | Ht 72.0 in | Wt 240.0 lb

## 2024-03-13 DIAGNOSIS — M542 Cervicalgia: Secondary | ICD-10-CM

## 2024-03-13 DIAGNOSIS — R2689 Other abnormalities of gait and mobility: Secondary | ICD-10-CM | POA: Diagnosis not present

## 2024-03-13 DIAGNOSIS — Z981 Arthrodesis status: Secondary | ICD-10-CM | POA: Diagnosis not present

## 2024-03-13 DIAGNOSIS — G959 Disease of spinal cord, unspecified: Secondary | ICD-10-CM

## 2024-03-13 MED ORDER — METHOCARBAMOL 500 MG PO TABS: 500.0000 mg | ORAL_TABLET | Freq: Four times a day (QID) | ORAL | 0 refills | Status: AC | PRN

## 2024-03-13 NOTE — Progress Notes (Signed)
" ° °  REFERRING PHYSICIAN:  Trin[place] Cassondra BROCKS, Md [address] [place],  [place] [address]  DOS: 09/16/23  ACDF C3-C5 for cervical myelopathy  HISTORY OF PRESENT ILLNESS:  [name] unfortunate continues to have problems with balance and neck pain.  His neck pain is mostly on the right side.   He did physical therapy but does not feel like it helped.  PHYSICAL EXAMINATION:  NEUROLOGICAL:  General: In no acute distress.   Awake, alert, oriented to person, place, and time.  Pupils equal round and reactive to light.  Facial tone is symmetric.    Strength: Side Biceps Triceps Deltoid Interossei Grip Wrist Ext. Wrist Flex.  R 5 5 5 5 5 5 5   L 5 5 5 5 5 5 5    Incision well healed  Imaging:  Appears fused at the operative levels.  He has extensive cervical spondylosis.  Assessment / Plan: Mario Wilson is doing fair s/p above surgery.  He is frustrated that he has not seen improvement in his balance or pain.   I discussed referral to neurology for his ongoing imbalance versus reimaging.  He would like to see neurology before considering reimaging.  For his neck pain, we discussed referral for consideration of injections versus consideration of cervical spinal cord stimulation.  I will refer him back to Dr. Avanell to discuss injections.  We also discussed use of NSAIDs and muscle relaxants.  I will prescribe some muscle relaxants for him.  I will see him back in 6 months or sooner if needed.  Mario Daisy MD Neurosurgery  "

## 2024-09-11 ENCOUNTER — Ambulatory Visit: Admitting: Neurosurgery
